# Patient Record
Sex: Male | Born: 1950 | Race: Asian | Hispanic: No | Marital: Married | Smoking: Current every day smoker
Health system: Southern US, Community
[De-identification: ages and names within clinical notes are randomized; demographics above are authoritative.]

## PROBLEM LIST (undated history)

## (undated) DIAGNOSIS — I10 Essential (primary) hypertension: Secondary | ICD-10-CM

---

## 2008-04-10 ENCOUNTER — Emergency Department (HOSPITAL_COMMUNITY): Admission: EM | Admit: 2008-04-10 | Discharge: 2008-04-10 | Payer: Self-pay | Admitting: Emergency Medicine

## 2014-06-04 ENCOUNTER — Emergency Department (HOSPITAL_COMMUNITY)
Admission: EM | Admit: 2014-06-04 | Discharge: 2014-06-05 | Disposition: A | Discharge status: Home / Self Care | Payer: Self-pay | Attending: Emergency Medicine | Admitting: Emergency Medicine

## 2014-06-04 ENCOUNTER — Emergency Department (HOSPITAL_COMMUNITY): Payer: Self-pay

## 2014-06-04 ENCOUNTER — Encounter (HOSPITAL_COMMUNITY): Payer: Self-pay

## 2014-06-04 DIAGNOSIS — I10 Essential (primary) hypertension: Secondary | ICD-10-CM | POA: Insufficient documentation

## 2014-06-04 DIAGNOSIS — M546 Pain in thoracic spine: Secondary | ICD-10-CM | POA: Insufficient documentation

## 2014-06-04 DIAGNOSIS — Z79899 Other long term (current) drug therapy: Secondary | ICD-10-CM | POA: Insufficient documentation

## 2014-06-04 DIAGNOSIS — Z720 Tobacco use: Secondary | ICD-10-CM | POA: Insufficient documentation

## 2014-06-04 DIAGNOSIS — R079 Chest pain, unspecified: Secondary | ICD-10-CM | POA: Insufficient documentation

## 2014-06-04 HISTORY — DX: Essential (primary) hypertension: I10

## 2014-06-04 LAB — BASIC METABOLIC PANEL
Anion gap: 13 (ref 5–15)
BUN: 10 mg/dL (ref 6–23)
CALCIUM: 9.1 mg/dL (ref 8.4–10.5)
CHLORIDE: 103 meq/L (ref 96–112)
CO2: 25 meq/L (ref 19–32)
CREATININE: 0.85 mg/dL (ref 0.50–1.35)
GFR calc Af Amer: >90 mL/min (ref >90)
GFR calc non Af Amer: >90 mL/min (ref >90)
Glucose, Bld: 97 mg/dL (ref 70–99)
Potassium: 3.6 mEq/L — ABNORMAL LOW (ref 3.7–5.3)
SODIUM: 141 meq/L (ref 137–147)

## 2014-06-04 LAB — I-STAT TROPONIN, ED: TROPONIN I, POC: 0 ng/mL (ref 0.00–0.08)

## 2014-06-04 LAB — CBC
HEMATOCRIT: 35.8 % — AB (ref 39.0–52.0)
HEMOGLOBIN: 11.7 g/dL — AB (ref 13.0–17.0)
MCH: 23.9 pg — AB (ref 26.0–34.0)
MCHC: 32.7 g/dL (ref 30.0–36.0)
MCV: 73.2 fL — ABNORMAL LOW (ref 78.0–100.0)
PLATELETS: 223 10*3/uL (ref 150–400)
RBC: 4.89 MIL/uL (ref 4.22–5.81)
RDW: 13.8 % (ref 11.5–15.5)
WBC: 6.3 10*3/uL (ref 4.0–10.5)

## 2014-06-04 MED ORDER — NITROGLYCERIN 0.4 MG SL SUBL
0.4000 mg | SUBLINGUAL_TABLET | SUBLINGUAL | Status: DC | PRN
  Administered 2014-06-04: 0.4 mg via SUBLINGUAL
  Filled 2014-06-04: qty 1

## 2014-06-04 MED ORDER — GI COCKTAIL ~~LOC~~
30.0000 mL | Freq: Once | ORAL | Status: AC
  Administered 2014-06-04: 30 mL via ORAL
  Filled 2014-06-04: qty 30

## 2014-06-04 NOTE — ED Notes (Signed)
PER EMS: starting at 0600 this morning pt began experiencing SOB and chest pain that radiates to mid upper back, right shoulder and arm. EMS adm 324 asa and 1 nitro with no relief. BP-159/97, HR-70, NSR,EKG unremarkable according to EMS.

## 2014-06-04 NOTE — ED Provider Notes (Signed)
CSN: [id number]     Arrival date & time 06/04/14  2128 History   First MD Initiated Contact with Patient 06/04/14 2139     Chief Complaint  Patient presents with  . Chest Pain     (Consider location/radiation/quality/duration/timing/severity/associated sxs/prior Treatment) Patient is a 63 y.o. male presenting with chest pain. The history is provided by the patient.  Chest Pain Pain location:  Substernal area Pain quality: pressure   Pain radiates to:  Upper back Pain radiates to the back: yes   Pain severity:  Severe Onset quality:  Gradual Duration:  15 hours Timing:  Constant Progression:  Unchanged Chronicity:  New Context: at rest   Context comment:  Patient noticed pain shortly after waking up at 6am Relieved by:  Nothing Worsened by:  Nothing tried Ineffective treatments:  None tried Associated symptoms: back pain and shortness of breath   Associated symptoms: no abdominal pain, no cough, no diaphoresis, no dizziness, no dysphagia, no fever, no headache, no nausea, no palpitations, no syncope, not vomiting and no weakness   Risk factors: hypertension   Risk factors: no aortic disease and no coronary artery disease     Past Medical History  Diagnosis Date  . Hypertension    History reviewed. No pertinent past surgical history. No family history on file. History  Substance Use Topics  . Smoking status: Current Every Day Smoker  . Smokeless tobacco: Not on file  . Alcohol Use: No    Review of Systems  Constitutional: Negative for fever, diaphoresis, activity change and appetite change.  HENT: Negative for facial swelling, sore throat, tinnitus, trouble swallowing and voice change.   Eyes: Negative for pain, redness and visual disturbance.  Respiratory: Positive for shortness of breath. Negative for cough, chest tightness and wheezing.   Cardiovascular: Positive for chest pain. Negative for palpitations, leg swelling and syncope.  Gastrointestinal: Negative for  nausea, vomiting, abdominal pain, diarrhea, constipation and abdominal distention.  Endocrine: Negative.   Genitourinary: Negative.  Negative for dysuria, decreased urine volume, scrotal swelling and testicular pain.  Musculoskeletal: Positive for back pain. Negative for myalgias and gait problem.  Skin: Negative.  Negative for rash.  Neurological: Negative.  Negative for dizziness, tremors, weakness and headaches.  Psychiatric/Behavioral: Negative for suicidal ideas, hallucinations and self-injury. The patient is not nervous/anxious.       Allergies  Review of patient's allergies indicates not on file.  Home Medications   Prior to Admission medications   Medication Sig Start Date End Date Taking? Authorizing Provider  acyclovir (ZOVIRAX) 400 MG tablet Take 1 tablet (400 mg total) by mouth 5 (five) times daily. 06/14/14   Lula OlszewskiMike Blayne Frankie, MD  oxyCODONE-acetaminophen (PERCOCET/ROXICET) 5-325 MG per tablet Take 1 tablet by mouth every 6 (six) hours as needed for moderate pain or severe pain. 06/05/14   Lula OlszewskiMike Allen Egerton, MD  Phenyleph-Doxylamine-DM-APAP (COLD & FLU NIGHTTIME/DAYTIME PO) Take 2 capsules by mouth daily as needed (for cold).   Yes Historical Provider, MD   BP 144/71 mmHg  Pulse 59  Temp(Src) 98.5 F (36.9 C) (Oral)  Resp 12  SpO2 99% Physical Exam  Constitutional: He is oriented to person, place, and time. He appears well-developed and well-nourished. No distress.  HENT:  Head: Normocephalic and atraumatic.  Right Ear: External ear normal.  Left Ear: External ear normal.  Nose: Nose normal.  Mouth/Throat: Oropharynx is clear and moist.  Eyes: Conjunctivae and EOM are normal. Pupils are equal, round, and reactive to light. No scleral icterus.  Neck: Normal  range of motion. Neck supple. No JVD present. No tracheal deviation present. No thyromegaly present.  Cardiovascular: Normal rate and intact distal pulses.  Exam reveals no gallop and no friction rub.   No murmur  heard. Pulmonary/Chest: Effort normal and breath sounds normal. No stridor. No respiratory distress. He has no wheezes. He has no rales.  Abdominal: Soft. He exhibits no distension. There is no tenderness. There is no rebound and no guarding.  Musculoskeletal: Normal range of motion. He exhibits tenderness (bilateral paraspinal upper thoracic back tender to palpation). He exhibits no edema.  Neurological: He is alert and oriented to person, place, and time. No cranial nerve deficit. He exhibits normal muscle tone. Coordination normal.  5/5 strength in all 4 extremities. Normal Gait.   Skin: Skin is warm and dry. Rash (faint papule rash on upper back. No vessicles. No induration or abscesses.) noted. He is not diaphoretic.  Psychiatric: He has a normal mood and affect. His behavior is normal.  Nursing note and vitals reviewed.   ED Course  Procedures (including critical care time) Labs Review Labs Reviewed  CBC - Abnormal; Notable for the following:    Hemoglobin 11.7 (*)    HCT 35.8 (*)    MCV 73.2 (*)    MCH 23.9 (*)    All other components within normal limits  BASIC METABOLIC PANEL - Abnormal; Notable for the following:    Potassium 3.6 (*)    All other components within normal limits  D-DIMER, QUANTITATIVE  I-STAT TROPOININ, ED  Rosezena Sensor, ED    Imaging Review Dg Chest 2 View  06/04/2014   CLINICAL DATA:  Acute onset of shortness of breath and cough. Right posterior upper chest pain. Initial encounter.  EXAM: CHEST  2 VIEW  COMPARISON:  None.  FINDINGS: The lungs are well-aerated and clear. There is no evidence of focal opacification, pleural effusion or pneumothorax.  The cardiomediastinal silhouette is mildly enlarged. No acute osseous abnormalities are seen.  IMPRESSION: Mild cardiomegaly; lungs remain grossly clear.   Electronically Signed   By: Roanna Raider M.D.   On: 06/04/2014 23:40     EKG Interpretation   Date/Time:  Monday June 04 2014 21:33:16  EST Ventricular Rate:  64 PR Interval:  50 QRS Duration: 107 QT Interval:  435 QTC Calculation: 449 R Axis:   87 Text Interpretation:  Sinus rhythm Short PR interval Borderline right axis  deviation ST elevation, consider anterior injury No old tracing to compare  Confirmed by [facility]  MD, TREY ([id number]) on 06/04/2014 11:11:50 PM      MDM   Final diagnoses:  Chest pain  Bilateral thoracic back pain    The patient is a 63 y.o. M with hx of HTN who presents with substernal radiating to his back since he woke up this morning. Patient AFVSS. Exam as above. EKG as above. Patient with wells 0 and undetectable d-dimer, do not suspect PE. Patient with equal pulses bilaterally no neurological deficits, normal mediastinum on chest xray, and undetectable d-dimer, do not suspect aortic dissection. Delta troponin are both undetectable. Patient ambulated in the ED with no desaturation or worsening of pain. Patient given pain meds and acyclovir for possible shingles but told to only fill acyclovir if vesicular rash develops. Patient given resources to establish care with PCP and standard ED return precautions. Patient and family express understanding and agreement with this plan.  I estimate there is LOW risk for PERICARDIAL TAMPONADE, PNEUMOTHORAX, PULMONARY EMBOLISM, ACUTE CORONARY SYNDROME, OR THORACIC  AORTIC DISSECTION, thus I consider the discharge disposition reasonable. We have discussed the diagnosis and risks, and we agree with discharging home to follow-up with their primary doctor. We also discussed returning to the Emergency Department immediately if new or worsening symptoms occur. We have discussed the symptoms which are most concerning (e.g., bloody sputum, fever, worsening pain or shortness of breath, vomiting) that necessitate immediate return.   Patient seen with attending, Dr. Loretha StaplerWofford, who oversaw clinical decision making.     Lula OlszewskiMike Brandy Zuba, MD 06/05/14 0111  Warnell Foresterrey Wofford,  MD 06/05/14 [phone]

## 2014-06-05 LAB — I-STAT TROPONIN, ED: Troponin i, poc: 0 ng/mL (ref 0.00–0.08)

## 2014-06-05 LAB — D-DIMER, QUANTITATIVE: D-Dimer, Quant: <0.27 ug/mL-FEU (ref 0.00–0.48)

## 2014-06-05 MED ORDER — OXYCODONE-ACETAMINOPHEN 5-325 MG PO TABS: 1.0000 | ORAL_TABLET | Freq: Four times a day (QID) | ORAL | Status: DC | PRN

## 2014-06-05 MED ORDER — OXYCODONE-ACETAMINOPHEN 5-325 MG PO TABS
1.0000 | ORAL_TABLET | Freq: Once | ORAL | Status: AC
  Administered 2014-06-05: 1 via ORAL
  Filled 2014-06-05: qty 1

## 2014-06-05 MED ORDER — ACYCLOVIR 400 MG PO TABS: 400.0000 mg | ORAL_TABLET | Freq: Every day | ORAL | Status: DC

## 2014-06-05 MED ORDER — MORPHINE SULFATE 4 MG/ML IJ SOLN
6.0000 mg | Freq: Once | INTRAMUSCULAR | Status: DC
Start: 2014-06-05 — End: 2014-06-05
  Filled 2014-06-05: qty 2

## 2014-06-05 NOTE — Discharge Instructions (Signed)
°Emergency Department Resource Guide °1) Find a Doctor and Pay Out of Pocket °Although you won't have to find out who is covered by your insurance plan, it is a good idea to ask around and get recommendations. You will then need to call the office and see if the doctor you have chosen will accept you as a new patient and what types of options they offer for patients who are self-pay. Some doctors offer discounts or will set up payment plans for their patients who do not have insurance, but you will need to ask so you aren't surprised when you get to your appointment. ° °2) Contact Your Local Health Department °Not all health departments have doctors that can see patients for sick visits, but many do, so it is worth a call to see if yours does. If you don't know where your local health department is, you can check in your phone book. The CDC also has a tool to help you locate your state's health department, and many state websites also have listings of all of their local health departments. ° °3) Find a Walk-in Clinic °If your illness is not likely to be very severe or complicated, you may want to try a walk in clinic. These are popping up all over the country in pharmacies, drugstores, and shopping centers. They're usually staffed by nurse practitioners or physician assistants that have been trained to treat common illnesses and complaints. They're usually fairly quick and inexpensive. However, if you have serious medical issues or chronic medical problems, these are probably not your best option. ° °No Primary Care Doctor: °- Call [facility] at  832-8000 - they can help you locate a primary care doctor that  accepts your insurance, provides certain services, etc. °- Physician Referral Service- 1-800-533-3463 ° °Chronic Pain Problems: °Organization         Address  Phone   Notes  °[facility]  (336) 297-2271 Patients need to be referred by their primary care doctor.  ° °Medication  Assistance: °Organization         Address  Phone   Notes  °[place] [facility] [address] °[place], [place] [address] (336) 641-8030 --Must be a resident of [place] °-- Must have NO insurance coverage whatsoever (no Medicaid/ Medicare, etc.) °-- The pt. MUST have a primary care doctor that directs their care regularly and follows them in the community °  °[facility]  (866) 331-1348   °[employer]  (888) 892-1162   ° °Agencies that provide inexpensive medical care: °Organization         Address  Phone   Notes  °[facility]  (336) 832-8035   °[facility]    (336) 832-7272   °[facility] [address] °[place], [place] [address] (336) 832-4777   °[facility] [address], °[place] (336) 271-4999   °[facility]    (336) 373-0678   °[facility]    (336) 272-1050   °[facility] ° [address], [place] Phone:  (336) 832-4444, Fax:  (336) 832-4440 Hours of Operation:  9 am - 6 pm, M-F.  Also accepts Medicaid/Medicare and self-pay.  °[facility] ° [address], [place] Phone: (336) 832-3150, Fax: (336) 832-3151. Hours of Operation:  8:30 am - 5:30 pm, M-F.  Also accepts Medicaid and self-pay.  °[facility] [place] [address], [place] Phone: (336) 878-6027   °[facility] [address], [place], [place] (336)723-1848, Ext. 123 Mondays & Thursdays: 7-9 AM.  First 15 patients are seen on a first come, first serve basis. °  ° °Medicaid-accepting [place] Providers: ° °Organization         Address  Phone   Notes  °[facility] [address], [place] (336) 641-2100 Also accepts self-pay patients.  °[facility] [address], [place] ° (336) 856-9996   °[facility] [address], [place]  (336) 288-8857   °[facility] [address] [place] [address], [place] (336) 299-7000   °Veita Bland [address], [place]  ° (336) 373-1557 Only accepts [place] Access Medicaid patients after they have their name applied to their card.  ° °Self-Pay (no insurance) in [place]: ° °Organization         Address  Phone   Notes  °Sickle Cell Patients, [facility] [address], [place] (336) 832-1970   °[facility] [address], [place] (336) 832-4400   °[facility] ° [address], [place] (336) 992-4800   °[facility]/Dr. Osei-Bonsu ° [address] [place] [address], [place] or [address], [place] (336) 841-8500 Phone number for both [place] and [place] locations is the same.  °[facility] [address], [place] (336) 299-0000   °[facility] [address] [place] [address], [place] or [address] (336) 852-7530 °(336) 878-2260   °[facility] [address], [place] (336) 350-1642, phone; (336) 294-5005, fax Sees patients 1st and 3rd Saturday of every month.  Must not qualify for public or private insurance (i.e. Medicaid, Medicare, [place] Health Choice, Veterans' Benefits) • Household income should be no more than 200% of the poverty level •The clinic cannot treat you if you are pregnant or think you are pregnant • Sexually transmitted diseases are not treated at the clinic.  ° ° °Dental Care: °Organization         Address  Phone  Notes  °[place] [facility] [address], [place] (336) 641-6152 Accepts children up to age 21 who are enrolled in Medicaid or [place] Health Choice; pregnant women with a Medicaid card; and children who have applied for Medicaid or [place] Health Choice, but were declined, whose parents can pay a reduced fee at time of service.  °[place]  [facility] [place]  [address], [place] (336) 641-7733 Accepts children up to age 21 who are enrolled in Medicaid or [place] Health Choice; pregnant women with a Medicaid card; and children who have applied for Medicaid or [place] Health Choice, but were declined, whose parents can pay a reduced fee at time of service.  °[facility] ° [address], [place] (336) 641-4533 Patients are seen by appointment only. Walk-ins are not accepted. [facility] will see patients 18 years of age and older. °Monday - Tuesday (8am-5pm) °Most Wednesdays (8:30-5pm) °$30 per visit, cash only  °[facility] ° [address], [place] (336) 641-4533 Patients are seen by appointment only. Walk-ins are not accepted. [facility] will see patients 18 years of age and older. °One   Wednesday Evening (Monthly: Volunteer Based).  $30 per visit, cash only  °[facility]  (919) 537-3737 for adults; Children under age 4, call Graduate Pediatric Dentistry at (919) [phone]. Children aged 4-14, please call (919) [phone] to request a pediatric application. ° Dental services are provided in all areas of dental care including fillings, crowns and bridges, complete and partial dentures, implants, gum treatment, root canals, and extractions. Preventive care is also provided. Treatment is provided to both adults and children. °Patients are selected via a lottery and there is often a waiting list. °  °[facility] [address], °[place] ° (336) 763-8833 [email or web address] °  °[facility] [address], [place], [place] (336)723-1848, Ext. 123 Second and Fourth Thursday of each month, opens at 6:30 AM; Clinic ends at 9 AM.  Patients are seen on a first-come first-served basis, and a limited number are seen during each clinic.  ° °[facility] ° [address], [place], [place] (336) 723-7904    Eligibility Requirements °You must have lived in [place], [place], or [place] counties for at least the last three months. °  You cannot be eligible for state or federal sponsored healthcare insurance, including Veterans Administration, Medicaid, or Medicare. °  You generally cannot be eligible for healthcare insurance through your employer.  °  How to apply: °Eligibility screenings are held every Tuesday and Wednesday afternoon from 1:00 pm until 4:00 pm. You do not need an appointment for the interview!  °[facility] [address], [place], [place] 336-631-2330   °[facility]  336-342-8273   °[facility]  336-703-3100   °[facility]  336-570-6415   ° °Behavioral Health Resources in the Community: °Intensive Outpatient Programs °Organization         Address  Phone  Notes  °[facility] [address], [place], [place] 336-878-6098   °[facility] [address], [place], [place] 336-832-9800   °[facility] [address], [place], [place] ° 336-882-2125   °[facility] [address],  °[place], [place] 1-800-853-5163 or 336-641-4981   °Substance Abuse Resources °Organization         Address  Phone  Notes  °[facility]  336-882-2125   °[facility]  336-784-9470   °[facility]  336-285-9073   °[facility]  336-845-3988   °Residential & Outpatient Substance Abuse Program  1-800-659-3381   °Psychological Services °Organization         Address  Phone  Notes  ° Health  336- 832-9600   °[facility]  336- 378-7881   °[facility] [address], [place] 1-800-853-5163 or 336-641-4981   ° °Mobile Crisis Teams °Organization         Address  Phone  Notes  °[facility], Mobile Crisis Care Unit  1-877-626-1772   °Assertive °[facility] ° [address]  [place], [place] 336-834-9664   °Sharon DeEsch [address] °[place] 336-554-5454   ° °Self-Help/Support Groups °Organization         Address  Phone             Notes  °[facility] - variety of support groups  336- 373-1402 Call for more information  °Narcotics Anonymous (NA), [facility] [address], °[place]  2 meetings at this location  ° °  Residential Treatment Programs °Organization         Address  Phone  Notes  °[facility] [address],    °[place]  1-866-801-8205   °[facility] ° [address], [place], [place] 704-293-8524   °[facility] [address], [place] 336-845-3988 Admissions: 8am-3pm M-F  °[facility] [address]    °[place], [place] 336-841-1104   °[facility] [address], [place], [place] 336-379-7146   °[facility] [address]  °[place], [place] 336-285-9073   °[facility] - Intensive Outpatient [address], [place], [place] 336-852-3033   °[facility] [address]  °[place], [place] 1-877-615-2722 or 336-784-9470   °[facility] ([facility] [address] , [place] 336-227-7417 Accepts Medicaid  °[facility] [address]  ° [place] 1-800-659-3381 Substance Abuse/Addiction Treatment  ° °[place] Behavioral Health Resources °Organization         Address  Phone  Notes  °[facility]  (888) 581-9988   °Julie Brannon, PhD [address] [place], [place]   (336) 349-5553 or (336) 951-0000   °[facility]   [address] °[place], [place] (336) 349-4454   °[facility] [address], [place], [place] (336) 342-8316 Insurance/Medicaid/sponsorship through [facility]  °[facility] [address]                                    [place], [place] (336) 342-8316 Therapy/tele-psych/case    °[facility] [address]  ° [place], [place] (336) 349-2233    °Dr. Arfeen  (336) 349-4544   °[facility] [place]  [employer] [place] [facility] 1) [address], [place] °2) [address], [place] °3)  [address], [place] (336) 349-3220 °(336) 342-7768 ° °(336) 342-8140   °[place] Child [facility] (336) 342-1394 or (336) 342-3537 (After Hours)    ° ° °

## 2014-06-05 NOTE — ED Notes (Signed)
Pt A&OX4, ambulatory at d/c with steady gait, NAD 

## 2014-06-05 NOTE — ED Provider Notes (Signed)
I saw and evaluated the patient, reviewed the resident's note and I agree with the findings and plan.   EKG Interpretation   Date/Time:  Monday June 04 2014 21:33:16 EST Ventricular Rate:  64 PR Interval:  50 QRS Duration: 107 QT Interval:  435 QTC Calculation: 449 R Axis:   87 Text Interpretation:  Sinus rhythm Short PR interval Borderline right axis  deviation ST elevation, consider anterior injury No old tracing to compare  Confirmed by [facility]  MD, TREY ([id number]) on 06/04/2014 11:11:50 PM      63 yo male presenting with back/chest pain.  He characterized his pain to me as back pain radiating to his front.  Located at thoracic right paraspinals.  Also reports some SOB.  On exam, well appearing, nontoxic, not distressed, normal respiratory effort, normal perfusion, heart sounds normal, RRR, lungs CTAB, patch of papules of skin at right thoracic region.  He appeared to be uncomfortable when changing positions.  His EKG showed nonspecific ST changes which appeared consistent with repolarization abnormality.  He had two negative troponins, negative D Dimer.  I suspect his pain may be coming from a developing case of varicella zoster.  Advised close outpatient follow up.   Clinical Impression: Right sided Thoracic Back pain   Warnell Foresterrey Umer Harig, MD 06/05/14 1257

## 2014-10-05 ENCOUNTER — Ambulatory Visit: Payer: Self-pay | Attending: Internal Medicine

## 2015-11-25 ENCOUNTER — Encounter (HOSPITAL_COMMUNITY): Payer: Self-pay | Admitting: Emergency Medicine

## 2015-11-25 DIAGNOSIS — K529 Noninfective gastroenteritis and colitis, unspecified: Secondary | ICD-10-CM | POA: Insufficient documentation

## 2015-11-25 DIAGNOSIS — I10 Essential (primary) hypertension: Secondary | ICD-10-CM | POA: Insufficient documentation

## 2015-11-25 DIAGNOSIS — Z79899 Other long term (current) drug therapy: Secondary | ICD-10-CM | POA: Insufficient documentation

## 2015-11-25 DIAGNOSIS — F17200 Nicotine dependence, unspecified, uncomplicated: Secondary | ICD-10-CM | POA: Insufficient documentation

## 2015-11-25 DIAGNOSIS — R1030 Lower abdominal pain, unspecified: Secondary | ICD-10-CM | POA: Diagnosis present

## 2015-11-25 LAB — URINE MICROSCOPIC-ADD ON
BACTERIA UA: NONE SEEN
SQUAMOUS EPITHELIAL / LPF: NONE SEEN
WBC, UA: NONE SEEN WBC/hpf (ref 0–5)

## 2015-11-25 LAB — COMPREHENSIVE METABOLIC PANEL
ALT: 47 U/L (ref 17–63)
AST: 23 U/L (ref 15–41)
Albumin: 4.2 g/dL (ref 3.5–5.0)
Alkaline Phosphatase: 71 U/L (ref 38–126)
Anion gap: 8 (ref 5–15)
BUN: 11 mg/dL (ref 6–20)
CHLORIDE: 103 mmol/L (ref 101–111)
CO2: 27 mmol/L (ref 22–32)
CREATININE: 1.04 mg/dL (ref 0.61–1.24)
Calcium: 9.5 mg/dL (ref 8.9–10.3)
GFR calc Af Amer: >60 mL/min (ref >60)
GFR calc non Af Amer: >60 mL/min (ref >60)
GLUCOSE: 109 mg/dL — AB (ref 65–99)
POTASSIUM: 4.3 mmol/L (ref 3.5–5.1)
SODIUM: 138 mmol/L (ref 135–145)
Total Bilirubin: 1.1 mg/dL (ref 0.3–1.2)
Total Protein: 7.4 g/dL (ref 6.5–8.1)

## 2015-11-25 LAB — URINALYSIS, ROUTINE W REFLEX MICROSCOPIC
Bilirubin Urine: NEGATIVE
GLUCOSE, UA: NEGATIVE mg/dL
Ketones, ur: NEGATIVE mg/dL
Leukocytes, UA: NEGATIVE
Nitrite: NEGATIVE
PH: 6 (ref 5.0–8.0)
Protein, ur: NEGATIVE mg/dL
SPECIFIC GRAVITY, URINE: 1.027 (ref 1.005–1.030)

## 2015-11-25 LAB — CBC WITH DIFFERENTIAL/PLATELET
BASOS PCT: 0 %
Basophils Absolute: 0 10*3/uL (ref 0.0–0.1)
EOS ABS: 0.1 10*3/uL (ref 0.0–0.7)
Eosinophils Relative: 1 %
HCT: 39.9 % (ref 39.0–52.0)
Hemoglobin: 12.6 g/dL — ABNORMAL LOW (ref 13.0–17.0)
Lymphocytes Relative: 26 %
Lymphs Abs: 2.4 10*3/uL (ref 0.7–4.0)
MCH: 23.2 pg — AB (ref 26.0–34.0)
MCHC: 31.6 g/dL (ref 30.0–36.0)
MCV: 73.6 fL — ABNORMAL LOW (ref 78.0–100.0)
MONO ABS: 0.6 10*3/uL (ref 0.1–1.0)
Monocytes Relative: 6 %
NEUTROS PCT: 67 %
Neutro Abs: 6.2 10*3/uL (ref 1.7–7.7)
PLATELETS: 239 10*3/uL (ref 150–400)
RBC: 5.42 MIL/uL (ref 4.22–5.81)
RDW: 14.6 % (ref 11.5–15.5)
WBC: 9.3 10*3/uL (ref 4.0–10.5)

## 2015-11-25 NOTE — ED Notes (Signed)
Pt c/o mid to lower abd pain onset yesterday.  Nausea with vomiting.  Last BM yesterday

## 2015-11-26 ENCOUNTER — Encounter (HOSPITAL_COMMUNITY): Payer: Self-pay | Admitting: Radiology

## 2015-11-26 ENCOUNTER — Emergency Department (HOSPITAL_COMMUNITY): Payer: BLUE CROSS/BLUE SHIELD

## 2015-11-26 ENCOUNTER — Emergency Department (HOSPITAL_COMMUNITY)
Admission: EM | Admit: 2015-11-26 | Discharge: 2015-11-26 | Disposition: A | Discharge status: Home / Self Care | Payer: BLUE CROSS/BLUE SHIELD | Attending: Emergency Medicine | Admitting: Emergency Medicine

## 2015-11-26 DIAGNOSIS — K529 Noninfective gastroenteritis and colitis, unspecified: Secondary | ICD-10-CM

## 2015-11-26 LAB — POC OCCULT BLOOD, ED: FECAL OCCULT BLD: NEGATIVE

## 2015-11-26 MED ORDER — CIPROFLOXACIN HCL 500 MG PO TABS
500.0000 mg | ORAL_TABLET | Freq: Once | ORAL | Status: AC
Start: 2015-11-26 — End: 2015-11-26
  Administered 2015-11-26: 500 mg via ORAL
  Filled 2015-11-26: qty 1

## 2015-11-26 MED ORDER — METRONIDAZOLE 500 MG PO TABS: 500.0000 mg | ORAL_TABLET | Freq: Three times a day (TID) | ORAL | Status: DC

## 2015-11-26 MED ORDER — METRONIDAZOLE 500 MG PO TABS
500.0000 mg | ORAL_TABLET | Freq: Once | ORAL | Status: AC
Start: 2015-11-26 — End: 2015-11-26
  Administered 2015-11-26: 500 mg via ORAL
  Filled 2015-11-26: qty 1

## 2015-11-26 MED ORDER — CIPROFLOXACIN HCL 500 MG PO TABS: 500.0000 mg | ORAL_TABLET | Freq: Two times a day (BID) | ORAL | Status: DC

## 2015-11-26 MED ORDER — IOPAMIDOL (ISOVUE-300) INJECTION 61%
INTRAVENOUS | Status: AC
  Administered 2015-11-26: 100 mL
  Filled 2015-11-26: qty 100

## 2015-11-26 NOTE — ED Provider Notes (Signed)
CSN: [id number]     Arrival date & time 11/25/15  1616 History   First MD Initiated Contact with Patient 11/26/15 0114     Chief Complaint  Patient presents with  . Abdominal Pain     (Consider location/radiation/quality/duration/timing/severity/associated sxs/prior Treatment) HPI Comments: Patient presents with complaint of midline lower abdominal pain starting one and a half days ago. Patient has had associated nausea and vomiting. He has had constipation. States difficulty starting urine stream however no pain with urination. No fevers. No history of abdominal surgeries. Last albumin was 2 days ago. No significant pain with bowel movements. No treatments prior to arrival. Onset of symptoms acute. Course is constant. Nothing makes symptoms better or worse.  Patient is a 65 y.o. male presenting with abdominal pain. The history is provided by the patient. The history is limited by a language barrier. A language interpreter was used (Family member).  Abdominal Pain Associated symptoms: constipation, nausea and vomiting   Associated symptoms: no chest pain, no cough, no diarrhea, no dysuria, no fever, no hematuria and no sore throat     Past Medical History  Diagnosis Date  . Hypertension    History reviewed. No pertinent past surgical history. No family history on file. Social History  Substance Use Topics  . Smoking status: Current Every Day Smoker  . Smokeless tobacco: None  . Alcohol Use: No    Review of Systems  Constitutional: Negative for fever.  HENT: Negative for rhinorrhea and sore throat.   Eyes: Negative for redness.  Respiratory: Negative for cough.   Cardiovascular: Negative for chest pain.  Gastrointestinal: Positive for nausea, vomiting, abdominal pain and constipation. Negative for diarrhea and blood in stool.  Genitourinary: Positive for difficulty urinating. Negative for dysuria, frequency and hematuria.  Musculoskeletal: Negative for myalgias.  Skin: Negative  for rash.  Neurological: Negative for headaches.    Allergies  Review of patient's allergies indicates not on file.  Home Medications   Prior to Admission medications   Medication Sig Start Date End Date Taking? Authorizing Provider  acyclovir (ZOVIRAX) 400 MG tablet Take 1 tablet (400 mg total) by mouth 5 (five) times daily. 06/14/14   Lula Olszewski, MD  oxyCODONE-acetaminophen (PERCOCET/ROXICET) 5-325 MG per tablet Take 1 tablet by mouth every 6 (six) hours as needed for moderate pain or severe pain. 06/05/14   Lula Olszewski, MD  Phenyleph-Doxylamine-DM-APAP (COLD & FLU NIGHTTIME/DAYTIME PO) Take 2 capsules by mouth daily as needed (for cold).    Historical Provider, MD   BP 180/77 mmHg  Pulse 58  Temp(Src) 97.6 F (36.4 C) (Oral)  Resp 20  Ht  (1.575 m)  Wt 65.772 kg  BMI 26.51 kg/m2  SpO2 100%   Physical Exam  Constitutional: He appears well-developed and well-nourished.  HENT:  Head: Normocephalic and atraumatic.  Eyes: Conjunctivae are normal. Right eye exhibits no discharge. Left eye exhibits no discharge.  Neck: Normal range of motion. Neck supple.  Cardiovascular: Normal rate, regular rhythm and normal heart sounds.   Pulmonary/Chest: Effort normal and breath sounds normal.  Abdominal: Soft. There is tenderness (Moderate periumbilical and midline lower abdominal pain.). There is no rebound and no guarding.  Genitourinary: Rectum normal. Rectal exam shows no external hemorrhoid, no fissure and anal tone normal. Prostate is not tender.  Neurological: He is alert.  Skin: Skin is warm and dry.  Psychiatric: He has a normal mood and affect.  Nursing note and vitals reviewed.   ED Course  Procedures (including critical care  time) Labs Review Labs Reviewed  CBC WITH DIFFERENTIAL/PLATELET - Abnormal; Notable for the following:    Hemoglobin 12.6 (*)    MCV 73.6 (*)    MCH 23.2 (*)    All other components within normal limits  COMPREHENSIVE METABOLIC PANEL -  Abnormal; Notable for the following:    Glucose, Bld 109 (*)    All other components within normal limits  URINALYSIS, ROUTINE W REFLEX MICROSCOPIC (NOT AT [facility]) - Abnormal; Notable for the following:    Color, Urine AMBER (*)    APPearance HAZY (*)    Hgb urine dipstick MODERATE (*)    All other components within normal limits  URINE MICROSCOPIC-ADD ON  POC OCCULT BLOOD, ED    Imaging Review Ct Abdomen Pelvis W Contrast  11/26/2015  CLINICAL DATA:  Mid to lower abdominal pain since yesterday. Nausea and vomiting. Periumbilical pain. EXAM: CT ABDOMEN AND PELVIS WITH CONTRAST TECHNIQUE: Multidetector CT imaging of the abdomen and pelvis was performed using the standard protocol following bolus administration of intravenous contrast. CONTRAST:  100mL ISOVUE-300 IOPAMIDOL (ISOVUE-300) INJECTION 61% COMPARISON:  None. FINDINGS: Dependent changes in the lung bases. Small amount of free fluid in the upper abdomen around the liver and spleen. The liver, spleen, gallbladder, pancreas, adrenal glands, kidneys, abdominal aorta, inferior vena cava, and retroperitoneal lymph nodes are unremarkable. Stomach, small bowel, and colon are not abnormally distended. There is diffuse wall thickening of the distal colon extending from the lower pelvic loops to the terminal ileum. There is fluid in the mesentery around the abnormal bowel loops. Changes are consistent with enteritis in could represent infectious or inflammatory causes. Crohn's disease could have this appearance. No free air in the abdomen. Pelvis: Borderline prostate gland at 3.7 cm. Bladder wall is not thickened. No pelvic mass or lymphadenopathy. Appendix is normal. Degenerative changes in the spine. No destructive bone lesions. IMPRESSION: Diffuse wall thickening in the terminal ileum and pelvic small bowel loops with associated mesenteric fluid. Changes are likely due to enteritis of nonspecific etiology. Free fluid in the abdomen and pelvis is likely  reactive. Electronically Signed   By: Burman NievesWilliam  Stevens M.D.   On: 11/26/2015 03:08   I have personally reviewed and evaluated these images and lab results as part of my medical decision-making.   2:04 AM Patient seen and examined. Work-up initiated. Medications ordered. Prostate exam performed with nurse chaperone. Minimal prostate tenderness. Given abdominal tenderness, unclear etiology, CT scan ordered.  Vital signs reviewed and are as follows: BP 180/77 mmHg  Pulse 58  Temp(Src) 97.6 F (36.4 C) (Oral)  Resp 20  Ht 5\' 2"  (1.575 m)  Wt 65.772 kg  BMI 26.51 kg/m2  SpO2 100%  3:39 AM Patient and family updated on results. No indication for admission. Pain is controlled.   Will place on cipro and flagyl. Pt does not have PCP. Referrals given.   The patient was urged to return to the Emergency Department immediately with worsening of current symptoms, worsening abdominal pain, persistent vomiting, blood noted in stools, fever, or any other concerns. The patient verbalized understanding.    MDM   Final diagnoses:  Enteritis   Patient with abdominal pain -- CT reveals enteritis/colitis with fluid. Vitals are stable, no fever. No signs of dehydration, tolerating PO's. Lungs are clear. Abx given. Supportive therapy indicated with return if symptoms worsen. Patient counseled.    Renne CriglerJoshua Juwana Thoreson, PA-C 11/26/15 [id number]  Gilda Creasehristopher J Pollina, MD 11/26/15 [phone]

## 2015-11-26 NOTE — ED Notes (Signed)
Patient transported to CT 

## 2015-11-26 NOTE — Discharge Instructions (Signed)
Please read and follow all provided instructions.  Your diagnoses today include:  1. Enteritis    Tests performed today include:  Blood counts and electrolytes  Blood tests to check liver and kidney function  Urine test to look for infection and pregnancy (in women)  CT scan - shows small bowel inflammation  Vital signs. See below for your results today.   Medications prescribed:   Ciprofloxacin - antibiotic  You have been prescribed an antibiotic medicine: take the entire course of medicine even if you are feeling better. Stopping early can cause the antibiotic not to work.   Metronidazole - antibiotic  You have been prescribed an antibiotic medicine: take the entire course of medicine even if you are feeling better. Stopping early can cause the antibiotic not to work. Do not drink alcohol when taking this medication.   Take any prescribed medications only as directed.  Home care instructions:   Follow any educational materials contained in this packet.   Drink clear liquids for the next 24 hours and introduce solid foods slowly after 24 hours using the b.r.a.t. diet (Bananas, Rice, Applesauce, Toast, Yogurt).    Follow-up instructions: Please follow-up with your primary care provider in the next 3 days for further evaluation of your symptoms. If you are not feeling better in 48 hours you may have a condition that is more serious and you need re-evaluation.   Return instructions:  SEEK IMMEDIATE MEDICAL ATTENTION IF:  If you have pain that does not go away or becomes severe   A temperature above 101F develops   Repeated vomiting occurs (multiple episodes)   If you have pain that becomes localized to portions of the abdomen. The right side could possibly be appendicitis. In an adult, the left lower portion of the abdomen could be colitis or diverticulitis.   Blood is being passed in stools or vomit (bright red or black tarry stools)   You develop chest pain,  difficulty breathing, dizziness or fainting, or become confused, poorly responsive, or inconsolable (young children)  If you have any other emergent concerns regarding your health  Additional Information: Abdominal (belly) pain can be caused by many things. Your caregiver performed an examination and possibly ordered blood/urine tests and imaging (CT scan, x-rays, ultrasound). Many cases can be observed and treated at home after initial evaluation in the emergency department. Even though you are being discharged home, abdominal pain can be unpredictable. Therefore, you need a repeated exam if your pain does not resolve, returns, or worsens. Most patients with abdominal pain don't have to be admitted to the hospital or have surgery, but serious problems like appendicitis and gallbladder attacks can start out as nonspecific pain. Many abdominal conditions cannot be diagnosed in one visit, so follow-up evaluations are very important.  Your vital signs today were: BP 180/77 mmHg   Pulse 58   Temp(Src) 97.6 F (36.4 C) (Oral)   Resp 20   Ht 5\' 2"  (1.575 m)   Wt 65.772 kg   BMI 26.51 kg/m2   SpO2 100% If your blood pressure (bp) was elevated above 135/85 this visit, please have this repeated by your doctor within one month. -------------- [employer] is a great source of information about community services available.  Access by dialing 2-1-1 from anywhere in [place], or by website -  PooledIncome.pl.   Other Local Resources (Updated 06/2015)  Financial Assistance   Services    Phone Number and Address  [facility]  Low-cost medical care - 1st and 3rd Saturday of every month  Must not qualify for public or private insurance and must have limited income [phone] [address] [place], [place]    [place] [facility] Social Services  Child care  Emergency assistance for housing and [employer]ilities  Food  stamps  Medicaid [id number] [address] [place], [place] [address]   [facility]  Low-cost medical care for children, communicable diseases, sexually-transmitted diseases, immunizations, maternity care, womens health and family planning [phone] [place], [place] [address]  [facility]   Medication assistance for [facility] [place] residents  Must meet income requirements [phone] [address] [place], [place].    [facility]  Child care  Emergency assistance for housing and [employer]Clark[employer]ilities  Food stamps  Medicaid [phone] [address] [place], [place] [address]  [facility]   Low-cost medical care,   Monday through Friday, 9 am to 6 pm.   Accepts Medicare/Medicaid, and self-pay [phone] [address] [place], [place] [address]  [facility]  Low-cost medical care - Monday through Friday, 8:30 am - 5:30 pm  Accepts Medicaid and self-pay [phone] [address] [place], [place] [address]   [facility]  Primary medical care, including for those with sickle cell disease  Accepts Medicare, Medicaid, insurance and self-pay [phone] [address] [place], [place]  [facility]   Primary medical care  Accepts Medicare, [place], insurance and self-pay [phone] [address] [place], [place] [address]   [facility] of Social Services  Child care  Emergency assistance for housing and [employer]ly[employer]Clark[employer]ilities  Food stamps  Medicaid [id number] [address] [place], [place] [address]  [facility] Department of Health and [employer] Services  Child care  Emergency assistance for housing and Kimberly[employer]Clarkutilities  Food stamps  Medicaid [phone] [address] [place], [place] [address]   [facility]  Medication Assistance Program  Medication assistance for [facility] residents with no insurance only  Must have a primary care doctor [phone] [address] [place], [place]  [facility]   Primary medical care  AvimorAccepts Medicare, [place]icaid, insurance  [phone] [address] [place], [place]  [facility]   Medication assistance [phone]  [facility]   Primary medical care  Accepts Medicare, [place], insurance and self-pay [phone] [address] [place], [place] [address]  [facility]   Primary medical care  Accepts Medicare, [place], insurance and self-pay [phone] [address] [place], [place] [address]  [facility]  For [place] residents between the ages of 2818 and 3064 who do not have any form of health insurance, Medicare, [place], or [place] benefits.  Services are provided free of charge to uninsured patients who fall within federal poverty guidelines.    Hours: Tuesdays and Thursdays, 4:15 - 8 pm [phone] [address], [address] [place], [place] [address]  [facility]     Primary medical care  Dental care  Nutritional counseling  Pharmacy  Accepts Medicaid, Medicare, most insurance.  Fees are adjusted based on ability to pay.   [phone] [facility] [address] [place], [place]  [phone] [facility] [address] [place], [place]  [phone] [facility] [place],  [place]  [phone] [facility], [address] [place], [place]  [phone] [facility] [address] [place], [place]  [facility]  Womens health and family planning [phone] [place], [place]  [facility] Department of Social Services  Child care  Emergency assistance for housing and  [place]ark  Medicaid [phone], [place], [place] [address]   [facility]    Ages 90 and older  Hours: Mondays and Thursdays, 7:00 am - 9:00 am Patients are seen on a first come, first served basis. [phone], [phone] [place], [place]  [facility] Division of Social Services  Child care  Emergency assistance for housing and [employer]  Medicaid [phone] [address], [place] [address]  [employer]  Medication assistance  Rental assistance  Food pantry  Medication assistance  Housing assistance  Emergency food distribution  Utility assistance [phone] [address] [place], [place]  [phone]  [address] [place], [place] [address] Hours: Tuesdays and Thursdays from 9am - 12 noon by appointment only  [phone] [address] [place], [place] [address]  [facility] - Lanae Boast   Accepts private insurance, [place], and [place].  Payment is based on a sliding scale for those without insurance.  Hours: Mondays, Tuesdays and Thursdays, 8:30 am - 5:30 pm.   [phone] [address] [place], [place]  [facility] - Family Medicine at [facility], [place], and [place].  Payment is based on a sliding scale for those without insurance. [phone] [address] [place], [place]  [facility] - Pediatrics at E. Scientist, research (physical sciences), [employer], and [place].  Payment is based on a sliding scale for those without insurance [phone] [address], [employer], [place]  [facility] - Pediatrics at [employer], [place], and [place].  Payment is based on a sliding scale for those without insurance. [phone] [address] [place], [place]  [facility] - Pediatrics at [facility], [place], and [place].  Payment is based on a sliding scale for those without insurance. [phone], [phone] , [place].    [facility]  Maternity care.  Accepts Medicaid and self-pay. [phone] [address] [place], [place]

## 2016-11-18 ENCOUNTER — Encounter: Payer: Self-pay | Admitting: Emergency Medicine

## 2016-11-18 ENCOUNTER — Ambulatory Visit (INDEPENDENT_AMBULATORY_CARE_PROVIDER_SITE_OTHER): Payer: BLUE CROSS/BLUE SHIELD | Admitting: Emergency Medicine

## 2016-11-18 VITALS — BP 166/81 | HR 68 | Temp 98.3°F | Resp 16 | Ht 62.84 in | Wt 150.6 lb

## 2016-11-18 DIAGNOSIS — I10 Essential (primary) hypertension: Secondary | ICD-10-CM

## 2016-11-18 MED ORDER — AMLODIPINE BESYLATE 10 MG PO TABS: 10.0000 mg | ORAL_TABLET | Freq: Every day | ORAL | 3 refills | Status: DC

## 2016-11-18 NOTE — Patient Instructions (Addendum)
   IF you received an x-ray today, you will receive an invoice from [facility]. Please contact [facility] at [phone] with questions or concerns regarding your invoice.   IF you received labwork today, you will receive an invoice from [facility]. Please contact [facility] at [phone] with questions or concerns regarding your invoice.   Our billing staff will not be able to assist you with questions regarding bills from these companies.  You will be contacted with the lab results as soon as they are available. The fastest way to get your results is to activate your My Chart account. Instructions are located on the last page of this paperwork. If you have not heard from us regarding the results in 2 weeks, please contact this office.     Hypertension Hypertension is another name for high blood pressure. High blood pressure forces your heart to work harder to pump blood. This can cause problems over time. There are two numbers in a blood pressure reading. There is a top number (systolic) over a bottom number (diastolic). It is best to have a blood pressure below 120/80. Healthy choices can help lower your blood pressure. You may need medicine to help lower your blood pressure if:  Your blood pressure cannot be lowered with healthy choices.  Your blood pressure is higher than 130/80.  Follow these instructions at home: Eating and drinking  If directed, follow the DASH eating plan. This diet includes: ? Filling half of your plate at each meal with fruits and vegetables. ? Filling one quarter of your plate at each meal with whole grains. Whole grains include whole wheat pasta, brown rice, and whole grain bread. ? Eating or drinking low-fat dairy products, such as skim milk or low-fat yogurt. ? Filling one quarter of your plate at each meal with low-fat (lean) proteins. Low-fat proteins include fish, skinless chicken, eggs, beans, and tofu. ? Avoiding fatty meat, cured  and processed meat, or chicken with skin. ? Avoiding premade or processed food.  Eat less than 1,500 mg of salt (sodium) a day.  Limit alcohol use to no more than 1 drink a day for nonpregnant women and 2 drinks a day for men. One drink equals 12 oz of beer, 5 oz of wine, or 1 oz of hard liquor. Lifestyle  Work with your doctor to stay at a healthy weight or to lose weight. Ask your doctor what the best weight is for you.  Get at least 30 minutes of exercise that causes your heart to beat faster (aerobic exercise) most days of the week. This may include walking, swimming, or biking.  Get at least 30 minutes of exercise that strengthens your muscles (resistance exercise) at least 3 days a week. This may include lifting weights or pilates.  Do not use any products that contain nicotine or tobacco. This includes cigarettes and e-cigarettes. If you need help quitting, ask your doctor.  Check your blood pressure at home as told by your doctor.  Keep all follow-up visits as told by your doctor. This is important. Medicines  Take over-the-counter and prescription medicines only as told by your doctor. Follow directions carefully.  Do not skip doses of blood pressure medicine. The medicine does not work as well if you skip doses. Skipping doses also puts you at risk for problems.  Ask your doctor about side effects or reactions to medicines that you should watch for. Contact a doctor if:  You think you are having a reaction to the   medicine you are taking.  You have headaches that keep coming back (recurring).  You feel dizzy.  You have swelling in your ankles.  You have trouble with your vision. Get help right away if:  You get a very bad headache.  You start to feel confused.  You feel weak or numb.  You feel faint.  You get very bad pain in your: ? Chest. ? Belly (abdomen).  You throw up (vomit) more than once.  You have trouble breathing. Summary  Hypertension is  another name for high blood pressure.  Making healthy choices can help lower blood pressure. If your blood pressure cannot be controlled with healthy choices, you may need to take medicine. This information is not intended to replace advice given to you by your health care provider. Make sure you discuss any questions you have with your health care provider. Document Released: 11/18/2007 Document Revised: 04/29/2016 Document Reviewed: 04/29/2016 Elsevier Interactive Patient Education  2018 [employer]  T?ng huy?t p Hypertension T?ng huy?t p, th??ng ???c g?i l huy?t p cao, l khi l?c b?m mu qua ??ng m?ch c?a qu v? qu m?nh. ??ng m?ch c?a qu v? l cc m?ch mu mang mu t? tim ?i kh?p c? th?. T?ng huy?t p khi?n tim lm vi?c v?t v? h?n ?? b?m mu v c th? khi?n cc ??ng m?ch tr? ln h?p ho?c c?ng. T?ng huy?t p khng ???c ?i?u tr? ho?c khng ki?m sot ???c c th? d?n t?i nh?i mu c? tim, ??t qu?, b?nh th?n v nh?ng v?n ?? khc. Ch? s? ?o huy?t p g?m m?t ch? s? cao trn m?t ch? s? th?p. Huy?t a?p ly? t???ng cu?a quy? vi? la? d??i 120/80. Ch? s? ??u tin ("??nh") ???c g?i l huy?t p tm thu. ?y l s? ?o p su?t trong ??ng m?ch khi tim qu v? ??p. Ch? s? th? hai ("?y") ???c g?i l huy?t p tm tr??ng. ?y l s? ?o p su?t trong ??ng m?ch khi tim qu v? ngh?[employer]. Nguyn nhn g gy ra? Khng r nguyn nhn gy ra tnh tr?ng ny. ?i?u g lm t?ng nguy c?? M?t s? y?u t? nguy c? d?n ??n huy?t p cao c th? ki?m sot ???c. M?t s? y?u t? khc th khng. Nh?ng y?u t? qu v? c th? thay ??i  Ht thu?c.  B? b?nh ti?u ???ng tup 2, cholesterol cao, ho?c c? hai.  Khng t?p th? d?c ho?c cc ho?t ??ng th? ch?t ??y ??Marland Kitchen.  Th?a cn.  ?n qu nhi?u ch?t bo, ???ng, ca-lo, ho?c mu?i (Natri).  U?ng qu nhi?u r??u. Nh?ng y?u t? kh ho?c khng th? thay ??i  B?nh th?n m?n tnh.  C ti?n s? gia ?nh b? cao huy?t p.  ?? tu?i. Nguy c? t?ng ln theo ?? tu?i.  Ch?ng t?c. Qu v? c th? c nguy c? cao  h?n n?u qu v? l ng??i M? g?c Phi.  Gi?i tnh. Nam gi?i c nguy c? cao h?n ph? n? tr??c tu?i 45. Sau tu?i 65, ph? n? c nguy c? cao h?n nam gi?i.  Ng?ng th? do t?c ngh?n khi ng?.  C?ng th?ng. Cc d?u hi?u ho?c tri?u ch?ng l g? Huy?t p qu cao (c?n t?ng huy?t p) c th? gy ra:  ?au ??u.  Lo u.  Kh th?.  Ch?y mu cam.  Bu?n nn v nn.  ?au ng?c n?ng.  C? ??ng gi?t gi?t qu v? khng th? ki?m sot ???c (co gi?t).  Ch?n ?on tnh tr?ng ny nh? th? no?  Tnh tr?ng ny ???c ch?n ?on b?ng cch ?o huy?t p c?a qu v? lc qu v? ng?i, ?? tay trn m?t m?t ph?ng. B?ng qu?n thi?t b? ?o huy?t p s? ???c qu?n tr?c ti?pvo vng da cnh tay pha trn c?a qu v? ngang v?i m?c tim. Huy?t p c?n ???c ?o t nh?t hai l?n trn cng m?t cnh tay. M?t s? tnh tr?ng nh?t ??nh c th? lm cho huy?t p khc nhau gi?a tay ph?i v tay tri c?a qu v?. M?t s? y?u t? nh?t ??nh c th? khi?n ch? s? ?o huy?t p th?p h?n ho?c cao h?n so v?i bnh th??ng (t?ng) trong th?i gian ng?n:  Khi huy?t p c?a qu v? ? phng khm c?a chuyn gia ch?m [place] s?c kh?e cao h?n so v?i lc qu v? ? nh, hi?n t??ng ny ???c g?i l t?ng huy?t p o chong tr?ng. H?u h?t nh?ng ng??i b? tnh tr?ng ny ??u khng c?n dng thu?c.  Khi huy?t p c?a qu v? lc ? nh cao h?n so v?i lc qu v? ? phng khm chuyn gia ch?m [place] s?c kh?e, hi?n t??ng ny ???c g?i l t?ng huy?t p m?t n?. H?u h?t nh?ng ng??i b? tnh tr?ng ny ??u c th? c?n dng thu?c ?? ki?m sot huy?t p.  N?u qu v? c ch? s? huy?t p cao trong m?t l?n khm ho?c qu v? c huy?t p bnh th??ng c km cc y?u t? nguy c? khc:  Qu v? c th? ???c yu c?u tr? l?i vo m?t ngy khc ?? ki?m tra l?i huy?t p.  Qu v? c th? ???c yu c?u theo di huy?t p t?i nh trong vng 1 tu?n ho?c lu h?n.  N?u qu v? ???c ch?n ?on b? t?ng huy?t p, qu v? c th? c?n th?c hi?n cc xt nghi?m mu ho?c ki?m tra hnh ?nh khc ?? gip chuyn gia ch?m [place] s?c kh?e hi?u nguy c? t?ng th? m?c cc  b?nh tr?ng khc. Tnh tr?ng ny ???c ?i?u tr? nh? th? no? Tnh tr?ng ny ???c ?i?u tr? b?ng cch thay ??i l?i s?ng lnh m?nh, ch?ng h?nh nh? ?n th?c ph?m c l?i cho s?c kh?e, t?p th? d?c nhi?u h?n v gi?m l??ng r??u u?ng vo. N?u thay ??i l?i s?ng khng ?? ?? ??a huy?t p v? m?c c th? ki?m sot ???c, chuyn gia ch?m  s?c kh?e c th? k ??n thu?c, v n?u:  Huy?t p tm thu c?a qu v? trn 130.  Huy?t p tm tr??ng c?a qu v? trn 80.  Huy?t p m?c tiu c nhn c?a qu v? c th? khc nhau ty thu?c v tnh tr?ng b?nh l, tu?i v cc nhn t? khc. Tun th? nh?ng h??ng d?n ny ? nh: ?n v u?ng  ?n ch? ?? giu ch?t x? v kali v t natri, ???ng ph? gia v ch?t bo. M?t k? ho?ch ?n m?u c tn ch? ?? ?n DASH (Cch ti?p c?n ?n u?ng ?? gi?m t?ng huy?t p). ?n theo cch ny: ? ?n nhi?u tri cy v rau t??i. Vo m?i b?a ?n, c? g?ng dnh m?t n?a ??a cho tri cy v rau. ? ?n ng? c?c nguyn h?t, ch?ng h?n nh? m ?ng lm t? b?t m nguyn cm, ho?c bnh m nguyn h?t. Cho ngu? c?c nguyn ca?m va?o m?t ph?n t? ??a c?a quy? vi?. ? ?n ho?c hu?ng cc s?n ph?m t? s?a t bo, ch?ng h?n nh? s?a ? b? kem ho?c s?a chua t bo. ? [place] nh?ng  mi?ng th?t nhi?u m?, th?t ? qua ch? bi?n ho?c th?t ??p mu?i v th?t gia c?m c da. Dnh kho?ng m?t ph?n t? ??a c?a qu v? cho cc protein khng m?, ch?ng h?n nh? c, th?t g khng da, ??u, tr?ng, v ??u ph?. ? Trnh nh?ng th?c ph?m ch? bi?n ho?c lm s?n. Nh?ng th?c ph?m ny th??ng c nhi?u natri, ???ng ph? gia v ch?t bo h?n.  Gi?m l??ng dng natri hng ngy c?a qu v?. H?u h?t nh?ng ng??i b? t?ng huy?t p ??u nn ?n d??i 1.500 mg natri m?i ngy.  Gi?i h?n l??ng r??u qu v? u?ng khng qu 1 ly m?i ngy v?i ph? n? khng mang thai v 2 ly m?i ngy v?i nam gi?i. M?t ly t??ng ???ng v?i 12 ao-x? bia, 5 ao-x? r??u vang, ho?c 1 ao-x? r??u m?nh. L?i s?ng  H?p tc v?i chuyn gia ch?m [place] s?c kh?e c?a qu v? ?? duy tr tr?ng l??ng c? th? c l?i cho s?c kh?e ho?c gi?m cn. Hy  h?i xem tr?ng l??ng no l l t??ng cho qu v?.  Dnh t nh?t 30 pht ?? t?p th? d?c m c th? khi?n tim qu v? ??p nhanh h?n (t?p th? d?c nh?p ?i?u) h?u h?t cc ngy trong tu?n. Cc ho?t ??ng c th? bao g?m ?i b?, b?i, ho?c ??p xe.  Bao g?m bi t?p t?ng c??ng c? (bi t?p khng l?c), ch?ng h?n nh? bi t?p [place] ho?c nng t?, nh? m?t ph?n c?a thi quen luy?n t?p hng tu?n c?a qu v?. C? g?ng t?p nh?ng lo?i bi t?p ny trong vng 30 pht t?i thi?u 3 ngy m?t tu?n.  Khng s? d?ng b?t k? s?n ph?m no ch?a nicotine ho?c thu?c l, ch?ng ha?n nh? thu?c l d?ng ht v thu?c l ?i?n t?. N?u qu v? c?n gip ?? ?? cai thu?c, hy h?i chuyn gia ch?m [place] s?c kh?e.  Theo di huy?t p c?a qu v? t?i nh theo h??ng d?n c?a chuyn gia ch?m [place] s?c kh?e.  Tun th? t?t c? cc cu?c h?n khm l?i theo ch? d?n c?a chuyn gia ch?m [place] s?c kh?e. ?i?u ny c vai tr quan tr?ng. Thu?c  Ch? s? d?ng thu?c khng k ??n v thu?c k ??n theo ch? d?n c?a chuyn gia ch?m [place] s?c kh?e. Lm theo ch? d?n m?t cch c?n th?n. Thu?c ?i?u tr? huy?t p ph?i ???c dng theo ??n ? k.  Khng b? li?u thu?c huy?t p. B? li?u khi?n qu v? c nguy c? g?p ph?i cc v?n ?? v c th? lm cho thu?c gi?m hi?u qu?Marland Kitchen  Hy h?i chuyn gia ch?m [place] s?c kh?e c?a qu v? v? nh?ng tc d?ng ph? ho?c ph?n ?ng v?i thu?c m qu v? ph?i theo di. Hy lin l?c v?i chuyn gia ch?m [place] s?c kh?e n?u:  Qu v? ngh? qu v? c ph?n ?ng v?i thu?c ?ang dng.  Qu v? b? ?au ??u ti?p t?c tr? l?i (ti pht).  Qu v? c?m th?y chng m?t.  Qu v? b? s?ng ph ? m?t c chn.  Qu v? c v?n ?? v? th? l?c. Yu c?u tr? gip ngay l?p t?c n?u:  Qu v? b? ?au ??u n?ng ho?c l l?n.  Qu v? b? y?u b?t th??ng ho?c t b.  Quy? vi? ca?m th?y bi? ng?t.  Qu v? b? ?au r?t nhi?u ? ng?c ho?c b?ng.  Qu v? nn nhi?u l?n.  Qu v? b? kh th?. Tm t?t  T?ng huy?t p l khi  l?c b?m mu qua ??ng m?ch c?a qu v? qu m?nh. N?u tnh tr?ng ny khng ???c ki?m sot, n c th?  khi?n qu v? g?p ph?i nguy c? bi?n ch?ng nghim tr?ng.  Huy?t p m?c tiu c nhn c?a qu v? c th? khc nhau ty thu?c v tnh tr?ng b?nh l, tu?i v cc nhn t? khc. ??i v?i h?u h?t m?i ng??i, huy?t p bnh th??ng l d??i 120/80.  ?i?u tr? t?ng huy?t p b?ng cch thay ??i l?i s?ng, dng thu?c, ho?c k?t h?p c? hai. Thay ??i l?i s?ng bao g?m gi?m cn, ?n ch? ?? ?n c l?i cho s?c kh?e, t mu?i, t?p th? d?c nhi?u h?n v h?n ch? u?ng r??u. Thng tin ny khng nh?m m?c ?ch thay th? cho l?i khuyn m chuyn gia ch?m [place] s?c kh?e ni v?i qu v?. Hy b?o ??m qu v? ph?i th?o lu?n b?t k? v?n ?? g m qu v? c v?i chuyn gia ch?m [place] s?c kh?e c?a qu v?. Document Released: 06/01/2005 Document Revised: 05/13/2016 Document Reviewed: 05/13/2016 Elsevier Interactive Patient Education  2018 [employer].

## 2016-11-18 NOTE — Progress Notes (Signed)
Shawn Ray 66 y.o.   Chief Complaint  Patient presents with  . Follow-up    blood pressure per patient out of medicationx 1 month  . Medication Refill    amlodipine    HISTORY OF PRESENT ILLNESS: This is a 66 y.o. male complaining of high blood pressure; asymptomatic; off meds x 1 month. Had BW done 1 month ago and told it was normal. HPI   Prior to Admission medications   Medication Sig Start Date End Date Taking? Authorizing Provider  amLODipine (NORVASC) 10 MG tablet Take 10 mg by mouth daily.   Yes [provider]  ciprofloxacin (CIPRO) 500 MG tablet Take 1 tablet (500 mg total) by mouth 2 (two) times daily. Patient not taking: Reported on 11/18/2016 11/26/15   Renne Crigler, PA-C  metroNIDAZOLE (FLAGYL) 500 MG tablet Take 1 tablet (500 mg total) by mouth 3 (three) times daily. Patient not taking: Reported on 11/18/2016 11/26/15   Renne Crigler, PA-C    Not on File  There are no active problems to display for this patient.   Past Medical History:  Diagnosis Date  . Hypertension     History reviewed. No pertinent surgical history.  Social History   Social History  . Marital status: Married    Spouse name: N/A  . Number of children: N/A  . Years of education: N/A   Occupational History  . Not on file.   Social History Main Topics  . Smoking status: Current Every Day Smoker    Packs/day: 0.50    Years: 50.00    Types: Pipe  . Smokeless tobacco: Never Used  . Alcohol use No  . Drug use: No  . Sexual activity: Not Currently   Other Topics Concern  . Not on file   Social History Narrative  . No narrative on file    Family History  Problem Relation Age of Onset  . Diabetes Mother   . Diabetes Sister      Review of Systems  Constitutional: Negative.  Negative for chills and fever.  HENT: Negative.   Eyes: Negative.   Respiratory: Negative for cough and shortness of breath.   Cardiovascular: Negative for chest pain, palpitations, leg swelling  and PND.  Gastrointestinal: Negative.  Negative for abdominal pain, diarrhea, nausea and vomiting.  Genitourinary: Negative.  Negative for dysuria and hematuria.  Musculoskeletal: Negative.  Negative for back pain, myalgias and neck pain.  Skin: Negative.  Negative for rash.  Neurological: Negative for dizziness, sensory change, focal weakness and headaches.  All other systems reviewed and are negative.  Vitals:   11/18/16 1509  BP: (!) 166/81  Pulse: 68  Resp: 16  Temp: 98.3 F (36.8 C)     Physical Exam  Constitutional: He is oriented to person, place, and time. He appears well-developed and well-nourished.  HENT:  Head: Normocephalic and atraumatic.  Nose: Nose normal.  Mouth/Throat: Oropharynx is clear and moist. No oropharyngeal exudate.  Eyes: Conjunctivae and EOM are normal. Pupils are equal, round, and reactive to light.  Neck: Normal range of motion. Neck supple. No JVD present. No thyromegaly present.  Cardiovascular: Normal rate, regular rhythm, normal heart sounds and intact distal pulses.   Pulmonary/Chest: Effort normal and breath sounds normal.  Abdominal: Soft. Bowel sounds are normal. He exhibits no distension. There is no tenderness.  Musculoskeletal: Normal range of motion.  Lymphadenopathy:    He has no cervical adenopathy.  Neurological: He is alert and oriented to person, place, and time. No  sensory deficit. He exhibits normal muscle tone.  Skin: Skin is warm and dry. Capillary refill takes less than 2 seconds. No rash noted.  Psychiatric: He has a normal mood and affect. His behavior is normal.  Vitals reviewed.    ASSESSMENT & PLAN: Shawn Ray was seen today for follow-up and medication refill.  Diagnoses and all orders for this visit:  Essential hypertension  Other orders -     amLODipine (NORVASC) 10 MG tablet; Take 1 tablet (10 mg total) by mouth daily.    Patient Instructions       IF you received an x-ray today, you will receive an  invoice [facility]. Please contact [facility] at [phone] with questions or concerns regarding your invoice.   IF you received labwork today, you will receive an invoice from [facility]. Please contact [facility] at [phone] with questions or concerns regarding your invoice.   Our billing staff will not be able to assist you with questions regarding bills from these companies.  You will be contacted with the lab results as soon as they are available. The fastest way to get your results is to activate your My Chart account. Instructions are located on the last page of this paperwork. If you have not heard from us regarding the results in 2 weeks, please contact this office.     Hypertension Hypertension is another name for high blood pressure. High blood pressure forces your heart to work harder to pump blood. This can cause problems over time. There are two numbers in a blood pressure reading. There is a top number (systolic) over a bottom number (diastolic). It is best to have a blood pressure below 120/80. Healthy choices can help lower your blood pressure. You may need medicine to help lower your blood pressure if:  Your blood pressure cannot be lowered with healthy choices.  Your blood pressure is higher than 130/80.  Follow these instructions at home: Eating and drinking  If directed, follow the DASH eating plan. This diet includes: ? Filling half of your plate at each meal with fruits and vegetables. ? Filling one quarter of your plate at each meal with whole grains. Whole grains include whole wheat pasta, brown rice, and whole grain bread. ? Eating or drinking low-fat dairy products, such as skim milk or low-fat yogurt. ? Filling one quarter of your plate at each meal with low-fat (lean) proteins. Low-fat proteins include fish, skinless chicken, eggs, beans, and tofu. ? Avoiding fatty meat, cured and processed meat, or chicken with skin. ? Avoiding  premade or processed food.  Eat less than 1,500 mg of salt (sodium) a day.  Limit alcohol use to no more than 1 drink a day for nonpregnant women and 2 drinks a day for men. One drink equals 12 oz of beer, 5 oz of wine, or 1 oz of hard liquor. Lifestyle  Work with your doctor to stay at a healthy weight or to lose weight. Ask your doctor what the best weight is for you.  Get at least 30 minutes of exercise that causes your heart to beat faster (aerobic exercise) most days of the week. This may include walking, swimming, or biking.  Get at least 30 minutes of exercise that strengthens your muscles (resistance exercise) at least 3 days a week. This may include lifting weights or pilates.  Do not use any products that contain nicotine or tobacco. This includes cigarettes and e-cigarettes. If you need help quitting, ask your doctor.  Check your  blood pressure at home as told by your doctor.  Keep all follow-up visits as told by your doctor. This is important. Medicines  Take over-the-counter and prescription medicines only as told by your doctor. Follow directions carefully.  Do not skip doses of blood pressure medicine. The medicine does not work as well if you skip doses. Skipping doses also puts you at risk for problems.  Ask your doctor about side effects or reactions to medicines that you should watch for. Contact a doctor if:  You think you are having a reaction to the medicine you are taking.  You have headaches that keep coming back (recurring).  You feel dizzy.  You have swelling in your ankles.  You have trouble with your vision. Get help right away if:  You get a very bad headache.  You start to feel confused.  You feel weak or numb.  You feel faint.  You get very bad pain in your: ? Chest. ? Belly (abdomen).  You throw up (vomit) more than once.  You have trouble breathing. Summary  Hypertension is another name for high blood pressure.  Making healthy  choices can help lower blood pressure. If your blood pressure cannot be controlled with healthy choices, you may need to take medicine. This information is not intended to replace advice given to you by your health care provider. Make sure you discuss any questions you have with your health care provider. Document Released: 11/18/2007 Document Revised: 04/29/2016 Document Reviewed: 04/29/2016 Elsevier Interactive Patient Education  2018 Elsevier Inc.  T?ng huy?t p Hypertension T?ng huy?t p, th??ng ???c g?i l huy?t p cao, l khi l?c b?m mu qua ??ng m?ch c?a qu v? qu m?nh. ??ng m?ch c?a qu v? l cc m?ch mu mang mu t? tim ?i kh?p c? th?. T?ng huy?t p khi?n tim lm vi?c v?t v? h?n ?? b?m mu v c th? khi?n cc ??ng m?ch tr? ln h?p ho?c c?ng. T?ng huy?t p khng ???c ?i?u tr? ho?c khng ki?m sot ???c c th? d?n t?i nh?i mu c? tim, ??t qu?, b?nh th?n v nh?ng v?n ?? khc. Ch? s? ?o huy?t p g?m m?t ch? s? cao trn m?t ch? s? th?p. Huy?t a?p ly? t???ng cu?a quy? vi? la? d??i 120/80. Ch? s? ??u tin ("??nh") ???c g?i l huy?t p tm thu. ?y l s? ?o p su?t trong ??ng m?ch khi tim qu v? ??p. Ch? s? th? hai ("?y") ???c g?i l huy?t p tm tr??ng. ?y l s? ?o p su?t trong ??ng m?ch khi tim qu v? ngh?[place] nhn g gy ra? Khng r nguyn nhn gy ra tnh tr?ng ny. ?i?u g lm t?ng nguy c?? M?t s? y?u t? nguy c? d?n ??n huy?t p cao c th? ki?m sot ???c. M?t s? y?u t? khc th khng. Nh?ng y?u t? qu v? c th? thay ??i  Ht thu?c.  B? b?nh ti?u ???ng tup 2, cholesterol cao, ho?c c? hai.  Khng t?p th? d?c ho?c cc ho?t ??ng th? ch?t ??y ??Marland Kitchen  Th?a cn.  ?n qu nhi?u ch?t bo, ???ng, ca-lo, ho?c mu?i (Natri).  U?ng qu nhi?u r??u. Nh?ng y?u t? kh ho?c khng th? thay ??i  B?nh th?n m?n tnh.  C ti?n s? gia ?nh b? cao huy?t p.  ?? tu?i. Nguy c? t?ng ln theo ?? tu?i.  Ch?ng t?c. Qu v? c th? c nguy c? cao h?n n?u qu v? l ng??i M? g?c Phi.  Gi?i tnh. Nam gi?i  c  nguy c? cao h?n ph? n? tr??c tu?i 45. Sau tu?i 65, ph? n? c nguy c? cao h?n nam gi?i.  Ng?ng th? do t?c ngh?n khi ng?.  C?ng th?ng. Cc d?u hi?u ho?c tri?u ch?ng l g? Huy?t p qu cao (c?n t?ng huy?t p) c th? gy ra:  ?au ??u.  Lo u.  Kh th?.  Ch?y mu cam.  Bu?n nn v nn.  ?au ng?c n?ng.  C? ??ng gi?t gi?t qu v? khng th? ki?m sot ???c (co gi?t).  Ch?n ?on tnh tr?ng ny nh? th? no? Tnh tr?ng ny ???c ch?n ?on b?ng cch ?o huy?t p c?a qu v? lc qu v? ng?i, ?? tay trn m?t m?t ph?ng. B?ng qu?n thi?t b? ?o huy?t p s? ???c qu?n tr?c ti?pvo vng da cnh tay pha trn c?a qu v? ngang v?i m?c tim. Huy?t p c?n ???c ?o t nh?t hai l?n trn cng m?t cnh tay. M?t s? tnh tr?ng nh?t ??nh c th? lm cho huy?t p khc nhau gi?a tay ph?i v tay tri c?a qu v?. M?t s? y?u t? nh?t ??nh c th? khi?n ch? s? ?o huy?t p th?p h?n ho?c cao h?n so v?i bnh th??ng (t?ng) trong th?i gian ng?n:  Khi huy?t p c?a qu v? ? phng khm c?a chuyn gia ch?m [place] s?c kh?e cao h?n so v?i lc qu v? ? nh, hi?n t??ng ny ???c g?i l t?ng huy?t p o chong tr?ng. H?u h?t nh?ng ng??i b? tnh tr?ng ny ??u khng c?n dng thu?c.  Khi huy?t p c?a qu v? lc ? nh cao h?n so v?i lc qu v? ? phng khm chuyn gia ch?m [place] s?c kh?e, hi?n t??ng ny ???c g?i l t?ng huy?t p m?t n?. H?u h?t nh?ng ng??i b? tnh tr?ng ny ??u c th? c?n dng thu?c ?? ki?m sot huy?t p.  N?u qu v? c ch? s? huy?t p cao trong m?t l?n khm ho?c qu v? c huy?t p bnh th??ng c km cc y?u t? nguy c? khc:  Qu v? c th? ???c yu c?u tr? l?i vo m?t ngy khc ?? ki?m tra l?i huy?t p.  Qu v? c th? ???c yu c?u theo di huy?t p t?i nh trong vng 1 tu?n ho?c lu h?n.  N?u qu v? ???c ch?n ?on b? t?ng huy?t p, qu v? c th? c?n th?c hi?n cc xt nghi?m mu ho?c ki?m tra hnh ?nh khc ?? gip chuyn gia ch?m [place] s?c kh?e hi?u nguy c? t?ng th? m?c cc b?nh tr?ng khc. Tnh tr?ng ny ???c ?i?u tr? nh? th?  no? Tnh tr?ng ny ???c ?i?u tr? b?ng cch thay ??i l?i s?ng lnh m?nh, ch?ng h?nh nh? ?n th?c ph?m c l?i cho s?c kh?e, t?p th? d?c nhi?u h?n v gi?m l??ng r??u u?ng vo. N?u thay ??i l?i s?ng khng ?? ?? ??a huy?t p v? m?c c th? ki?m sot ???c, chuyn gia ch?m [place] s?c kh?e c th? k ??n thu?c, v n?u:  Huy?t p tm thu c?a qu v? trn 130.  Huy?t p tm tr??ng c?a qu v? trn 80.  Huy?t p m?c tiu c nhn c?a qu v? c th? khc nhau ty thu?c v tnh tr?ng b?nh l, tu?i v cc nhn t? khc. Tun th? nh?ng h??ng d?n ny ? nh: ?n v u?ng  ?n ch? ?? giu ch?t x? v kali v t natri, ???ng ph? gia v ch?t bo. M?t k? ho?ch ?n m?u c tn ch? ?? ?n DASH (Cch  ti?p c?n ?n u?ng ?? gi?m t?ng huy?t p). ?n theo cch ny: ? ?n nhi?u tri cy v rau t??i. Vo m?i b?a ?n, c? g?ng dnh m?t n?a ??a cho tri cy v rau. ? ?n ng? c?c nguyn h?t, ch?ng h?n nh? m ?ng lm t? b?t m nguyn cm, ho?c bnh m nguyn h?t. Cho ngu? c?c nguyn ca?m va?o m?t ph?n t? ??a c?a quy? vi?. ? ?n ho?c hu?ng cc s?n ph?m t? s?a t bo, ch?ng h?n nh? s?a ? b? kem ho?c s?a chua t bo. ? Trnh nh?ng mi?ng th?t nhi?u m?, th?t ? qua ch? bi?n ho?c th?t ??p mu?i v th?t gia c?m c da. Dnh kho?ng m?t ph?n t? ??a c?a qu v? cho cc protein khng m?, ch?ng h?n nh? c, th?t g khng da, ??u, tr?ng, v ??u ph?. ? Trnh nh?ng th?c ph?m ch? bi?n ho?c lm s?n. Nh?ng th?c ph?m ny th??ng c nhi?u natri, ???ng ph? gia v ch?t bo h?n.  Gi?m l??ng dng natri hng ngy c?a qu v?. H?u h?t nh?ng ng??i b? t?ng huy?t p ??u nn ?n d??i 1.500 mg natri m?i ngy.  Gi?i h?n l??ng r??u qu v? u?ng khng qu 1 ly m?i ngy v?i ph? n? khng mang thai v 2 ly m?i ngy v?i nam gi?i. M?t ly t??ng ???ng v?i 12 ao-x? bia, 5 ao-x? r??u vang, ho?c 1 ao-x? r??u m?nh. L?i s?ng  H?p tc v?i chuyn gia ch?m [place] s?c kh?e c?a qu v? ?? duy tr tr?ng l??ng c? th? c l?i cho s?c kh?e ho?c gi?m cn. Hy h?i xem tr?ng l??ng no l l t??ng cho qu v?.  Dnh  t nh?t 30 pht ?? t?p th? d?c m c th? khi?n tim qu v? ??p nhanh h?n (t?p th? d?c nh?p ?i?u) h?u h?t cc ngy trong tu?n. Cc ho?t ??ng c th? bao g?m ?i b?, b?i, ho?c ??p xe.  Bao g?m bi t?p t?ng c??ng c? (bi t?p khng l?c), ch?ng h?n nh? bi t?p [place] ho?c nng t?, nh? m?t ph?n c?a thi quen luy?n t?p hng tu?n c?a qu v?. C? g?ng t?p nh?ng lo?i bi t?p ny trong vng 30 pht t?i thi?u 3 ngy m?t tu?n.  Khng s? d?ng b?t k? s?n ph?m no ch?a nicotine ho?c thu?c l, ch?ng ha?n nh? thu?c l d?ng ht v thu?c l ?i?n t?. N?u qu v? c?n gip ?? ?? cai thu?c, hy h?i chuyn gia ch?m [place] s?c kh?e.  Theo di huy?t p c?a qu v? t?i nh theo h??ng d?n c?a chuyn gia ch?m [place] s?c kh?e.  Tun th? t?t c? cc cu?c h?n khm l?i theo ch? d?n c?a chuyn gia ch?m [place] s?c kh?e. ?i?u ny c vai tr quan tr?ng. Thu?c  Ch? s? d?ng thu?c khng k ??n v thu?c k ??n theo ch? d?n c?a chuyn gia ch?m [place] s?c kh?e. Lm theo ch? d?n m?t cch c?n th?n. Thu?c ?i?u tr? huy?t p ph?i ???c dng theo ??n ? k.  Khng b? li?u thu?c huy?t p. B? li?u khi?n qu v? c nguy c? g?p ph?i cc v?n ?? v c th? lm cho thu?c gi?m hi?u qu?Marland Kitchen  Hy h?i chuyn gia ch?m [place] s?c kh?e c?a qu v? v? nh?ng tc d?ng ph? ho?c ph?n ?ng v?i thu?c m qu v? ph?i theo di. Hy lin l?c v?i chuyn gia ch?m [place] s?c kh?e n?u:  Qu v? ngh? qu v? c ph?n ?ng v?i thu?c ?ang dng.  Qu v? b? ?au ??u ti?p  t?c tr? l?i (ti pht).  Qu v? c?m th?y chng m?t.  Qu v? b? s?ng ph ? m?t c chn.  Qu v? c v?n ?? v? th? l?c. Yu c?u tr? gip ngay l?p t?c n?u:  Qu v? b? ?au ??u n?ng ho?c l l?n.  Qu v? b? y?u b?t th??ng ho?c t b.  Quy? vi? ca?m th?y bi? ng?t.  Qu v? b? ?au r?t nhi?u ? ng?c ho?c b?ng.  Qu v? nn nhi?u l?n.  Qu v? b? kh th?. Tm t?t  T?ng huy?t p l khi l?c b?m mu qua ??ng m?ch c?a qu v? qu m?nh. N?u tnh tr?ng ny khng ???c ki?m sot, n c th? khi?n qu v? g?p ph?i nguy c? bi?n ch?ng nghim  tr?ng.  Huy?t p m?c tiu c nhn c?a qu v? c th? khc nhau ty thu?c v tnh tr?ng b?nh l, tu?i v cc nhn t? khc. ??i v?i h?u h?t m?i ng??i, huy?t p bnh th??ng l d??i 120/80.  ?i?u tr? t?ng huy?t p b?ng cch thay ??i l?i s?ng, dng thu?c, ho?c k?t h?p c? hai. Thay ??i l?i s?ng bao g?m gi?m cn, ?n ch? ?? ?n c l?i cho s?c kh?e, t mu?i, t?p th? d?c nhi?u h?n v h?n ch? u?ng r??u. Thng tin ny khng nh?m m?c ?ch thay th? cho l?i khuyn m chuyn gia ch?m [place] s?c kh?e ni v?i qu v?. Hy b?o ??m qu v? ph?i th?o lu?n b?t k? v?n ?? g m qu v? c v?i chuyn gia ch?m [place] s?c kh?e c?a qu v?. Document Released: 06/01/2005 Document Revised: 05/13/2016 Document Reviewed: 05/13/2016 Elsevier Interactive Patient Education  2018 Elsevier Inc.    Edwina Barth, MD [facility]

## 2016-11-29 IMAGING — CT CT ABD-PELV W/ CM
2 of 5 series · 16 of 46 positions shown, 18 images · IV contrast (Omni 300)
Comparison: None.

CLINICAL DATA: Mid to lower abdominal pain since yesterday. Nausea
and vomiting. Periumbilical pain.

EXAM:
CT ABDOMEN AND PELVIS WITH CONTRAST
TECHNIQUE: Multidetector CT imaging of the abdomen and pelvis was performed
using the standard protocol following bolus administration of
intravenous contrast.
CONTRAST:  100mL 25T4RS-QXX IOPAMIDOL (25T4RS-QXX) INJECTION 61%

[Series 2: a/p w/ 5mm · axial · 0.75mm/px · z∈[-523,-98]mm · 13 of 95 slices shown, 15 images]
[im 5/95  soft-tissue]
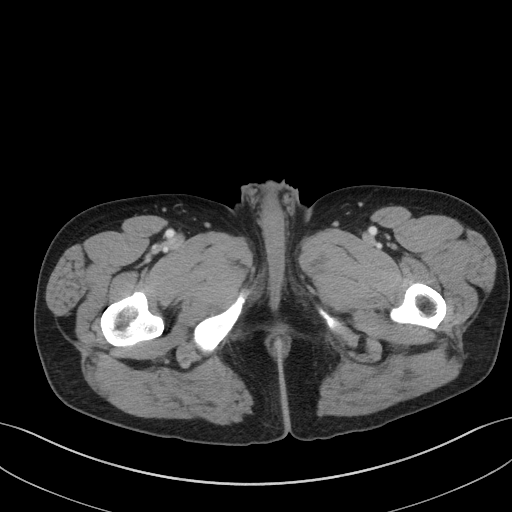
[im 5/95  bone]
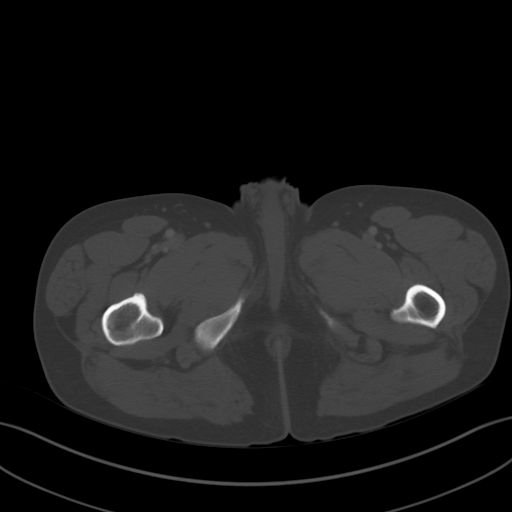
[im 15/95  soft-tissue]
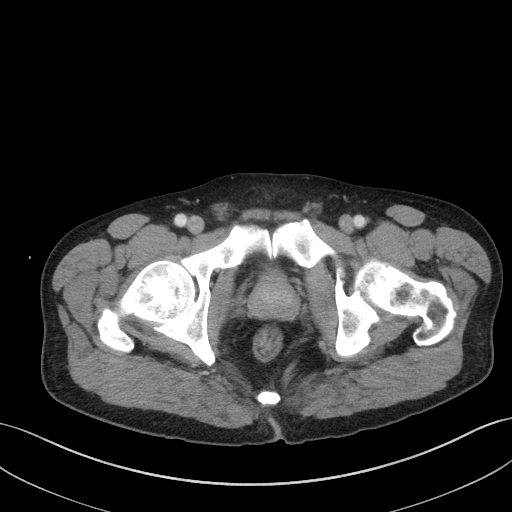
[im 20/95  soft-tissue]
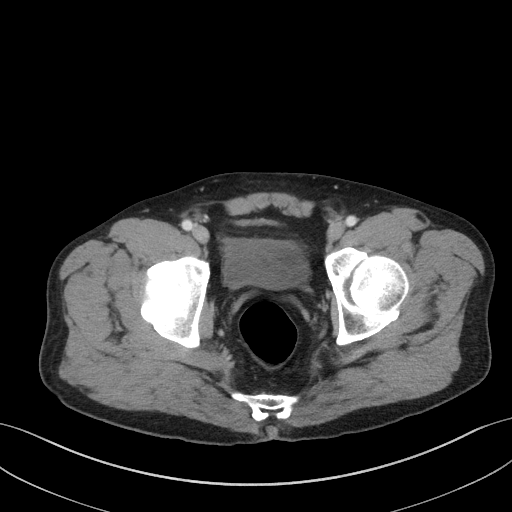
[im 25/95  soft-tissue]
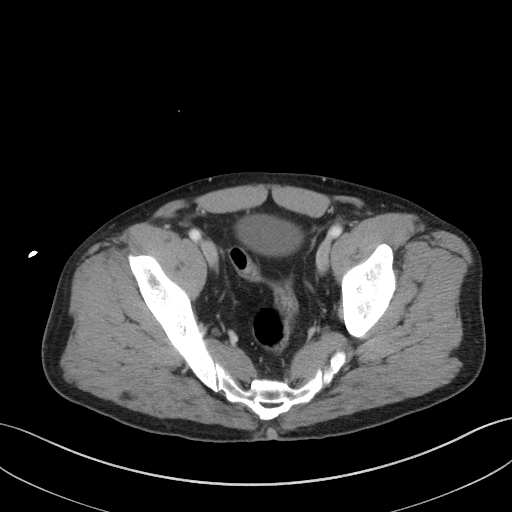
[im 35/95  soft-tissue]
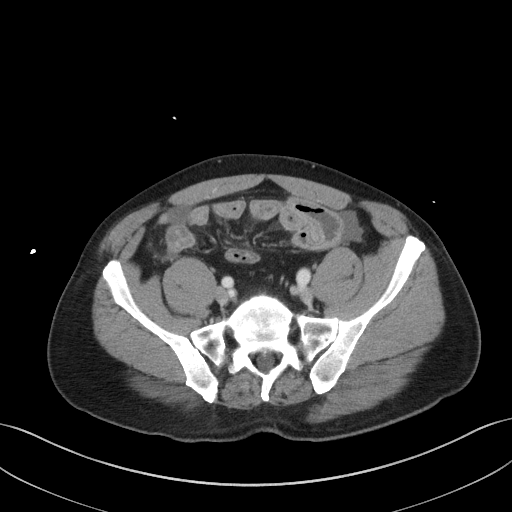
[im 40/95  soft-tissue]
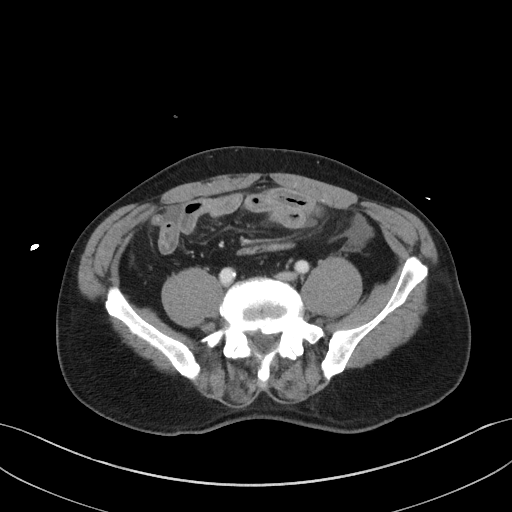
[im 50/95  soft-tissue]
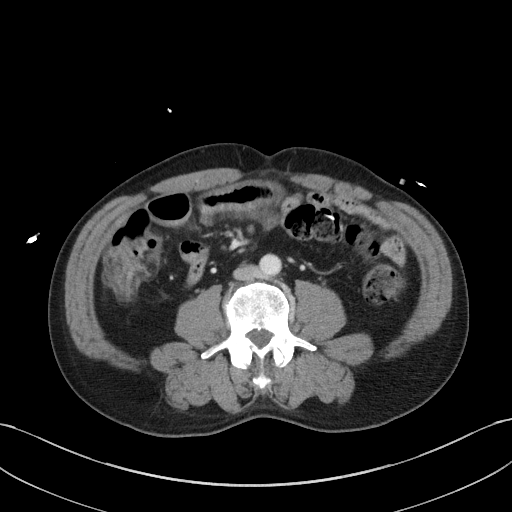
[im 55/95  soft-tissue]
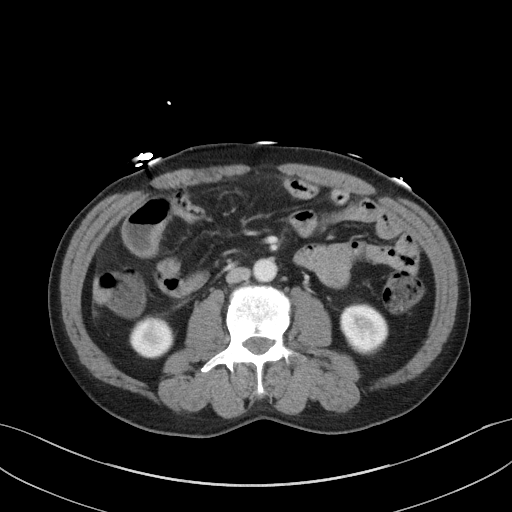
[im 60/95  soft-tissue]
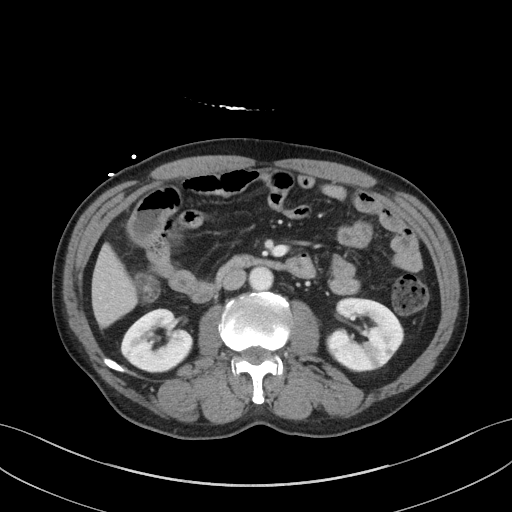
[im 60/95  bone]
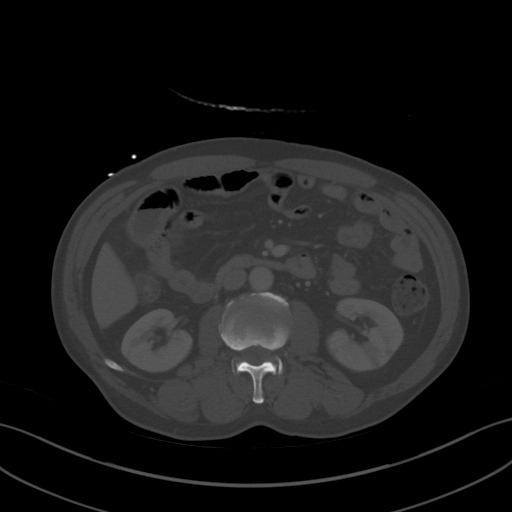
[im 70/95  soft-tissue]
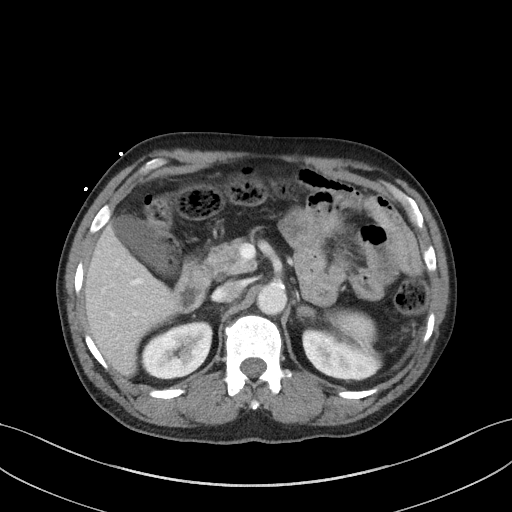
[im 75/95  soft-tissue]
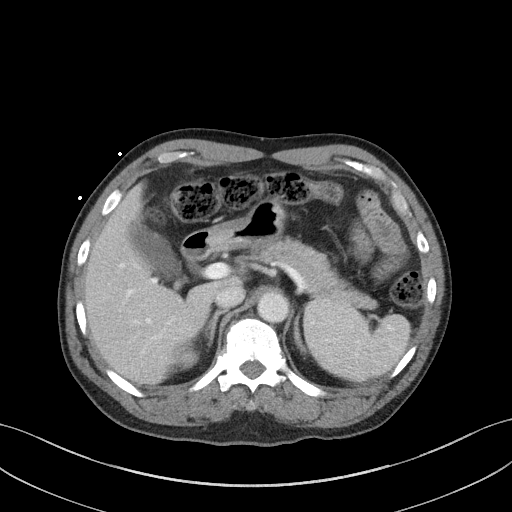
[im 80/95  soft-tissue]
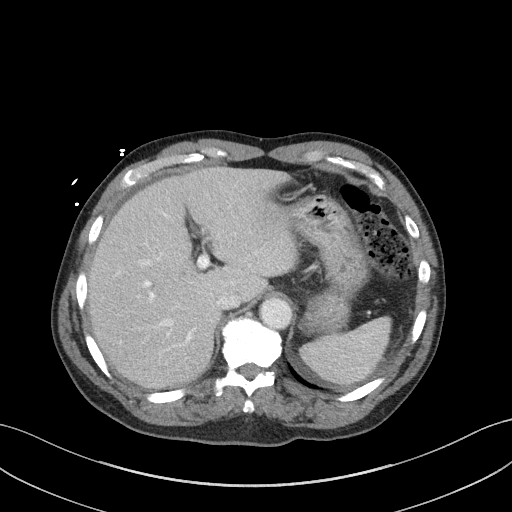
[im 90/95  soft-tissue]
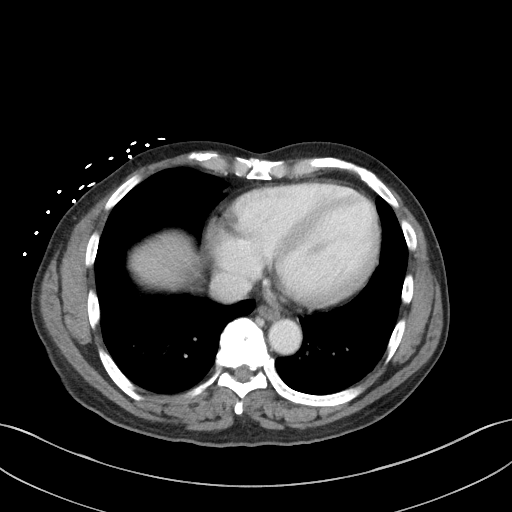

[Series 5: a/p w/ cor · coronal · 0.74mm/px · 3 of 111 slices shown]
[im 37/111  soft-tissue]
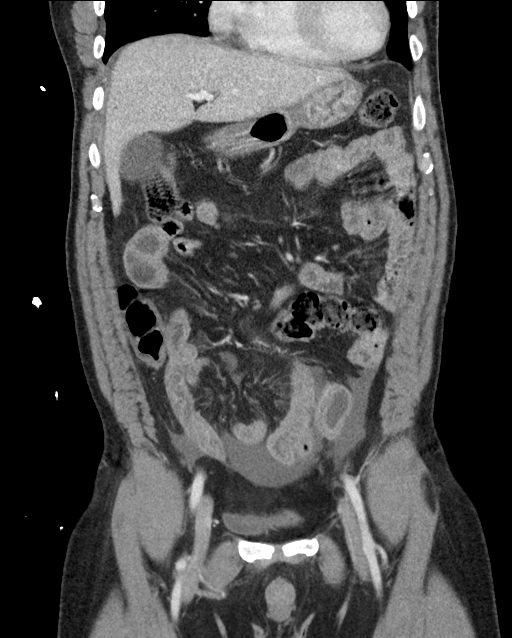
[im 49/111  soft-tissue]
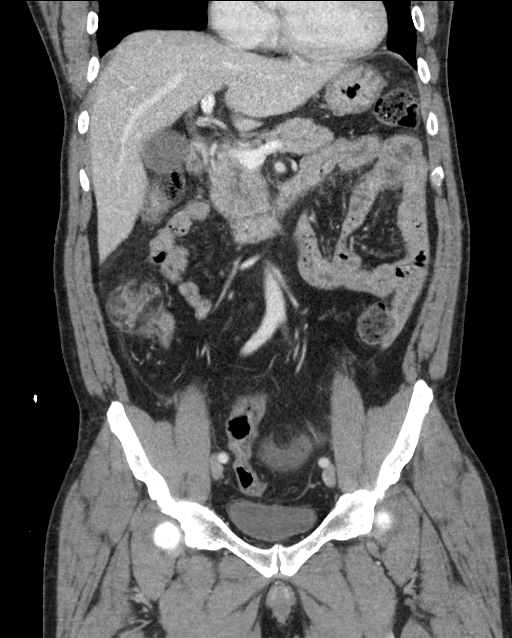
[im 62/111  soft-tissue]
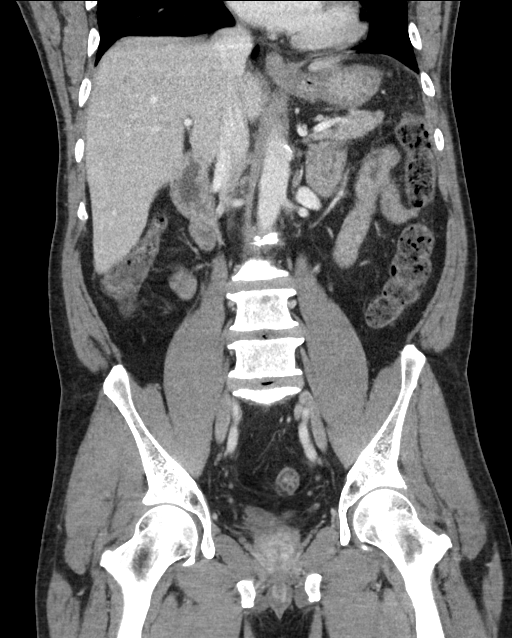

[16 of 46 positions shown; findings below may reference images not displayed]

FINDINGS: Dependent changes in the lung bases.

Small amount of free fluid in the upper abdomen around the liver and
spleen. The liver, spleen, gallbladder, pancreas, adrenal glands,
kidneys, abdominal aorta, inferior vena cava, and retroperitoneal
lymph nodes are unremarkable. Stomach, small bowel, and colon are
not abnormally distended. There is diffuse wall thickening of the
distal colon extending from the lower pelvic loops to the terminal
ileum. There is fluid in the mesentery around the abnormal bowel
loops. Changes are consistent with enteritis in could represent
infectious or inflammatory causes. Crohn's disease could have this
appearance. No free air in the abdomen.

Pelvis: Borderline prostate gland at 3.7 cm. Bladder wall is not
thickened. No pelvic mass or lymphadenopathy. Appendix is normal.
Degenerative changes in the spine. No destructive bone lesions.
IMPRESSION: Diffuse wall thickening in the terminal ileum and pelvic small bowel
loops with associated mesenteric fluid. Changes are likely due to
enteritis of nonspecific etiology. Free fluid in the abdomen and
pelvis is likely reactive.

## 2017-05-21 ENCOUNTER — Ambulatory Visit: Payer: BLUE CROSS/BLUE SHIELD | Admitting: Emergency Medicine

## 2017-10-18 ENCOUNTER — Ambulatory Visit (INDEPENDENT_AMBULATORY_CARE_PROVIDER_SITE_OTHER): Payer: BLUE CROSS/BLUE SHIELD | Admitting: Diagnostic Neuroimaging

## 2017-10-18 ENCOUNTER — Encounter: Payer: Self-pay | Admitting: Diagnostic Neuroimaging

## 2017-10-18 VITALS — BP 174/94 | HR 81 | Ht 64.0 in | Wt 151.0 lb

## 2017-10-18 DIAGNOSIS — R29898 Other symptoms and signs involving the musculoskeletal system: Secondary | ICD-10-CM | POA: Diagnosis not present

## 2017-10-18 DIAGNOSIS — R202 Paresthesia of skin: Secondary | ICD-10-CM

## 2017-10-18 DIAGNOSIS — R200 Anesthesia of skin: Secondary | ICD-10-CM

## 2017-10-18 MED ORDER — GABAPENTIN 300 MG PO CAPS: 300.0000 mg | ORAL_CAPSULE | Freq: Two times a day (BID) | ORAL | 6 refills | Status: AC

## 2017-10-18 NOTE — Patient Instructions (Signed)
-   MRI brain (rule out stroke)  - MRI cervical spine (rule out pinched nerve)  - gabapentin  at bedtime; may increase to twice a day   - follow up with primary care doctor re: hypertension (high blood pressure)

## 2017-10-18 NOTE — Progress Notes (Signed)
[facility]  PATIENT: Shawn Ray DOB: 06/11/1951  REFERRING CLINICIAN: Lue HISTORY FROM: patient and daughter (via Nurse, learning disability) REASON FOR VISIT: new consult    HISTORICAL  CHIEF COMPLAINT:  Chief Complaint  Patient presents with  . New Patient (Initial Visit)    neck pain/numbness; pain extends down his Left shoulder. He also reports numbness  . Referral    Dr. Demetrio Lapping  . Room 6    He is here with [place] interpreter & his daughter    HISTORY OF PRESENT ILLNESS:   67 year old male here for evaluation of neck pain and left arm numbness.  Symptoms started 1 month ago.  He has some numbness rating to the right hand.  He is also having weakness in his left hand.  No prodromal accidents, injuries, falls.  No problem with his left face or left leg.   REVIEW OF SYSTEMS: Full 14 system review of systems performed and negative with exception of: Ringing in ears blurred vision headache snoring shortness of breath.  ALLERGIES: Not on File  HOME MEDICATIONS: Outpatient Medications Prior to Visit  Medication Sig Dispense Refill  . amLODipine (NORVASC) 10 MG tablet Take 1 tablet (10 mg total) by mouth daily. 90 tablet 3  . HYDROcodone-acetaminophen (NORCO/VICODIN) 5-325 MG tablet Take 1 tablet by mouth every 8 (eight) hours as needed.    Marland Kitchen lisinopril-hydrochlorothiazide (PRINZIDE,ZESTORETIC) 20-12.5 MG tablet Take 1 tablet by mouth daily.    . naproxen (NAPROSYN) 500 MG tablet Take 500 mg by mouth 2 (two) times daily with a meal.    . ciprofloxacin (CIPRO) 500 MG tablet Take 1 tablet (500 mg total) by mouth 2 (two) times daily. (Patient not taking: Reported on 11/18/2016) 14 tablet 0  . metroNIDAZOLE (FLAGYL) 500 MG tablet Take 1 tablet (500 mg total) by mouth 3 (three) times daily. (Patient not taking: Reported on 11/18/2016) 21 tablet 0   No facility-administered medications prior to visit.     PAST MEDICAL HISTORY: Past Medical History:  Diagnosis Date  . Hypertension       PAST SURGICAL HISTORY: History reviewed. No pertinent surgical history.  FAMILY HISTORY: Family History  Problem Relation Age of Onset  . Diabetes Mother   . Other Mother        neck pain and arm sensory changes  . Diabetes Sister     SOCIAL HISTORY:  Social History   Socioeconomic History  . Marital status: Married    Spouse name: Not on file  . Number of children: 4  . Years of education: Not on file  . Highest education level: 7th grade  Occupational History  . Not on file  Social Needs  . Financial resource strain: Not on file  . Food insecurity:    Worry: Not on file    Inability: Not on file  . Transportation needs:    Medical: Not on file    Non-medical: Not on file  Tobacco Use  . Smoking status: Current Every Day Smoker    Packs/day: 0.50    Years: 50.00    Pack years: 25.00    Types: Pipe  . Smokeless tobacco: Never Used  Substance and Sexual Activity  . Alcohol use: No  . Drug use: No  . Sexual activity: Not Currently  Lifestyle  . Physical activity:    Days per week: Not on file    Minutes per session: Not on file  . Stress: Not on file  Relationships  . Social connections:  Talks on phone: Not on file    Gets together: Not on file    Attends religious service: Not on file    Active member of club or organization: Not on file    Attends meetings of clubs or organizations: Not on file    Relationship status: Not on file  . Intimate partner violence:    Fear of current or ex partner: Not on file    Emotionally abused: Not on file    Physically abused: Not on file    Forced sexual activity: Not on file  Other Topics Concern  . Not on file  Social History Narrative   Lives at home with his wife and children   Right handed   Caffeine: 1 cup of coffee in the morning     PHYSICAL EXAM  GENERAL EXAM/CONSTITUTIONAL: Vitals:  Vitals:   10/18/17 1623  BP: (!) 174/94  Pulse: 81  Weight: 151 lb (68.5 kg)  Height:  (1.626 m)      Body mass index is 25.92 kg/m.  No exam data present  Patient is in no distress; well developed, nourished and groomed; neck is supple  CARDIOVASCULAR:  Examination of carotid arteries is normal; no carotid bruits  Regular rate and rhythm, no murmurs  Examination of peripheral vascular system by observation and palpation is normal  EYES:  Ophthalmoscopic exam of optic discs and posterior segments is normal; no papilledema or hemorrhages  MUSCULOSKELETAL:  Gait, strength, tone, movements noted in Neurologic exam below  NEUROLOGIC: MENTAL STATUS:  No flowsheet data found.  awake, alert, oriented to person, place and time  recent and remote memory intact  normal attention and concentration  language fluent, comprehension intact, naming intact,   fund of knowledge appropriate  CRANIAL NERVE:   2nd - no papilledema on fundoscopic exam  2nd, 3rd, 4th, 6th - pupils equal and reactive to light, visual fields full to confrontation, extraocular muscles intact, no nystagmus  5th - facial sensation symmetric  7th - facial strength --> DECR PP IN LEFT FACE   8th - hearing intact  9th - palate elevates symmetrically, uvula midline  11th - shoulder shrug symmetric  12th - tongue protrusion midline  MOTOR:   normal bulk and tone  RUE AND BLE 5/5  LUE (DELTOID 4, BICEPS 4, TRICEP 4, GRIP 5); LIMITED BY PAIN  SENSORY:   normal and symmetric to light touch  DECR PP, TEMP AND VIB IN LEFT HAND  DECR VIB AN TEMP IN BILATERAL FEET, pinprick, temperature, vibration  COORDINATION:   finger-nose-finger, fine finger movements normal  REFLEXES:   deep tendon reflexes TRACE and symmetric  GAIT/STATION:   narrow based gait    DIAGNOSTIC DATA (LABS, IMAGING, TESTING) - I reviewed patient records, labs, notes, testing and imaging myself where available.  Lab Results  Component Value Date   WBC 9.3 11/25/2015   HGB 12.6 (L) 11/25/2015   HCT 39.9  11/25/2015   MCV 73.6 (L) 11/25/2015   PLT 239 11/25/2015      Component Value Date/Time   NA 138 11/25/2015 1743   K 4.3 11/25/2015 1743   CL 103 11/25/2015 1743   CO2 27 11/25/2015 1743   GLUCOSE 109 (H) 11/25/2015 1743   BUN 11 11/25/2015 1743   CREATININE 1.04 11/25/2015 1743   CALCIUM 9.5 11/25/2015 1743   PROT 7.4 11/25/2015 1743   ALBUMIN 4.2 11/25/2015 1743   AST 23 11/25/2015 1743   ALT 47 11/25/2015 1743   ALKPHOS  71 11/25/2015 1743   BILITOT 1.1 11/25/2015 1743   GFRNONAA >60 11/25/2015 1743   GFRAA >60 11/25/2015 1743   No results found for: CHOL, HDL, LDLCALC, LDLDIRECT, TRIG, CHOLHDL No results found for: ZHYQ6V No results found for: VITAMINB12 No results found for: TSH     ASSESSMENT AND PLAN  67 y.o. year old male here with neck pain rating to the left arm.  Neurologic exam notable for decreased sensation left face and left arm with some weakness in the left arm.  Signs and symptoms concerning for right brain stroke.  Cervical radicular apathy also consideration.  Ddx: stroke vs cervical radiculopathy  1. Left arm weakness   2. Numbness and tingling of left side of face      PLAN:  - MRI brain (rule out stroke) - MRI cervical spine (rule out cervical radiculopathy) - gabapentin  at bedtime for pain / numbness; may increase to twice a day - may consider physical therapy - follow up with PCP re: hypertension  Orders Placed This Encounter  Procedures  . MR BRAIN WO CONTRAST  . MR CERVICAL SPINE WO CONTRAST   Meds ordered this encounter  Medications  . gabapentin (NEURONTIN) 300 MG capsule    Sig: Take 1 capsule (300 mg total) by mouth 2 (two) times daily.    Dispense:  60 capsule    Refill:  6   Return in about 6 months (around 04/20/2018).  I reviewed images, labs, notes, records myself. I summarized findings and reviewed with patient, for this high risk condition (possible stroke) requiring high complexity decision making.     Suanne Marker, MD 10/18/2017, 4:32 PM Certified in Neurology, Neurophysiology and Neuroimaging  [facility] [address] [place], [place] [address] [phone]

## 2017-10-20 ENCOUNTER — Telehealth: Payer: Self-pay | Admitting: Diagnostic Neuroimaging

## 2017-10-20 NOTE — Telephone Encounter (Signed)
The MRI's are pending with [facility] I faxed clinical notes to [phone] and the tracking number is [id number].

## 2017-10-21 NOTE — Telephone Encounter (Signed)
I called the check the status on the tracking number they said they did receive the clinical notes and the turn around time Is 2-5 business days.

## 2017-10-26 NOTE — Telephone Encounter (Signed)
I called to check the status on the tracking number and they informed me it is still pending and waiting on the nurse to review it.. They stated that it takes 15 days for the turn around.

## 2017-11-01 NOTE — Telephone Encounter (Signed)
[employer] [place] did not approve the Cervical spine.  [employer] stated" we see that you have change in sense of feel in your hand feet. The guidelines says that you must have a problem and your doctor must have given doctor's  notes that say your nerve tests (NCV/EMG) are bad. Otherwise the notes must say you did neck stretches (physical therapy, chiropractic treatments, or medically directed home exercise program) for six weeks in the last six months. The notes must say you also did other things. Those things are resting more (decrease activity level) plus using heat and or ice. The notes must say you were taking pain medicine"  A peer to peer is an option. The phone number is [phone] option 3 and tracking number is [id number].

## 2017-11-03 NOTE — Telephone Encounter (Signed)
Noted, thank you

## 2017-11-03 NOTE — Telephone Encounter (Signed)
The MRI Brain has been approved. Authorization [id number] (exp. 11/02/17 to 12/02/17)  Would you want the patient to proceed on having the MRI Brain??

## 2017-11-03 NOTE — Telephone Encounter (Signed)
Ok to proceed witrh MRI brain for now; will hold off on cervical spine. -VRP

## 2018-03-15 ENCOUNTER — Ambulatory Visit (HOSPITAL_COMMUNITY)
Admission: EM | Admit: 2018-03-15 | Discharge: 2018-03-15 | Disposition: A | Discharge status: Home / Self Care | Payer: BLUE CROSS/BLUE SHIELD | Attending: Family Medicine | Admitting: Family Medicine

## 2018-03-15 ENCOUNTER — Other Ambulatory Visit: Payer: Self-pay

## 2018-03-15 ENCOUNTER — Encounter (HOSPITAL_COMMUNITY): Payer: Self-pay | Admitting: *Deleted

## 2018-03-15 DIAGNOSIS — R51 Headache: Secondary | ICD-10-CM

## 2018-03-15 DIAGNOSIS — Z760 Encounter for issue of repeat prescription: Secondary | ICD-10-CM | POA: Diagnosis not present

## 2018-03-15 DIAGNOSIS — I10 Essential (primary) hypertension: Secondary | ICD-10-CM

## 2018-03-15 MED ORDER — AMLODIPINE BESYLATE 10 MG PO TABS: 10.0000 mg | ORAL_TABLET | Freq: Every day | ORAL | 0 refills | Status: DC

## 2018-03-15 MED ORDER — LISINOPRIL-HYDROCHLOROTHIAZIDE 20-12.5 MG PO TABS: 1.0000 | ORAL_TABLET | Freq: Every day | ORAL | 0 refills | Status: DC

## 2018-03-15 NOTE — Discharge Instructions (Addendum)
Take the blood pressure medicine daily You need a primary care doctor to follow your blood pressure and for your bowel complaint

## 2018-03-15 NOTE — ED Provider Notes (Signed)
[facility]    CSN: [id number] Arrival date & time: 03/15/18  4132     History   Chief Complaint Chief Complaint  Patient presents with  . Medication Refill    HPI Shawn Ray is a 67 y.o. male.   HPI  Ran out of BP medicine a month ago Has had increased headaches since then Denies chest pain or shortness of breath Had an episode of dizziness Otherwise well Some loose bowels at times No history of heart disease  Past Medical History:  Diagnosis Date  . Hypertension     Patient Active Problem List   Diagnosis Date Noted  . Essential hypertension 11/18/2016    History reviewed. No pertinent surgical history.     Home Medications    Prior to Admission medications   Medication Sig Start Date End Date Taking? Authorizing Provider  amLODipine (NORVASC) 10 MG tablet Take 1 tablet (10 mg total) by mouth daily. 03/15/18   Eustace Moore, MD  gabapentin (NEURONTIN) 300 MG capsule Take 1 capsule (300 mg total) by mouth 2 (two) times daily. 10/18/17   Penumalli, Glenford Bayley, MD  lisinopril-hydrochlorothiazide (PRINZIDE,ZESTORETIC) 20-12.5 MG tablet Take 1 tablet by mouth daily. 03/15/18   Eustace Moore, MD    Family History Family History  Problem Relation Age of Onset  . Diabetes Mother   . Other Mother        neck pain and arm sensory changes  . Diabetes Sister     Social History Social History   Tobacco Use  . Smoking status: Current Every Day Smoker    Packs/day: 0.50    Years: 50.00    Pack years: 25.00    Types: Pipe  . Smokeless tobacco: Never Used  Substance Use Topics  . Alcohol use: No  . Drug use: No     Allergies   Patient has no allergy information on record.   Review of Systems Review of Systems  Constitutional: Negative for chills and fever.  HENT: Negative for ear pain and sore throat.   Eyes: Negative for pain and visual disturbance.  Respiratory: Negative for cough and shortness of breath.   Cardiovascular: Negative  for chest pain and palpitations.  Gastrointestinal: Negative for abdominal pain and vomiting.  Genitourinary: Negative for dysuria and hematuria.  Musculoskeletal: Negative for arthralgias and back pain.  Skin: Negative for color change and rash.  Neurological: Positive for headaches. Negative for seizures and syncope.  All other systems reviewed and are negative. daughter is here to provide language interpretation   Physical Exam Triage Vital Signs ED Triage Vitals  Enc Vitals Group     BP 03/15/18 0852 (!) 163/86     Pulse Rate 03/15/18 0852 68     Resp 03/15/18 0852 18     Temp 03/15/18 0852 97.9 F (36.6 C)     Temp Source 03/15/18 0852 Oral     SpO2 03/15/18 0852 98 %     Weight --      Height --      Head Circumference --      Peak Flow --      Pain Score 03/15/18 0914 0     Pain Loc --      Pain Edu? --      Excl. in GC? --    No data found.  Updated Vital Signs BP (!) 163/86 (BP Location: Left Arm)   Pulse 68   Temp 97.9 F (36.6 C) (Oral)   Resp  18   SpO2 98%   Physical Exam  Constitutional: He appears well-developed and well-nourished. No distress.  HENT:  Head: Normocephalic and atraumatic.  Mouth/Throat: Oropharynx is clear and moist.  Eyes: Pupils are equal, round, and reactive to light. Conjunctivae are normal.  Neck: Normal range of motion. No JVD present.  Cardiovascular: Normal rate, regular rhythm and normal heart sounds.  Pulmonary/Chest: Effort normal and breath sounds normal. No respiratory distress. He has no rales.  Abdominal: Soft. Bowel sounds are normal. He exhibits no distension.  No HSM  Musculoskeletal: Normal range of motion. He exhibits no edema.  Lymphadenopathy:    He has no cervical adenopathy.  Neurological: He is alert.  Skin: Skin is warm and dry.  Psychiatric: He has a normal mood and affect. His behavior is normal.     UC Treatments / Results  Labs (all labs ordered are listed, but only abnormal results are  displayed) Labs Reviewed - No data to display  EKG None  Radiology No results found.  Procedures Procedures (including critical care time)  Medications Ordered in UC Medications - No data to display  Initial Impression / Assessment and Plan / UC Course  I have reviewed the triage vital signs and the nursing notes.  Pertinent labs & imaging results that were available during my care of the patient were reviewed by me and considered in my medical decision making (see chart for details).      Importance of establishing with a PCP is discussed  Final Clinical Impressions(s) / UC Diagnoses   Final diagnoses:  Medication refill  Essential hypertension     Discharge Instructions     Take the blood pressure medicine daily You need a primary care doctor to follow your blood pressure and for your bowel complaint    ED Prescriptions    Medication Sig Dispense Auth. Provider   amLODipine (NORVASC) 10 MG tablet Take 1 tablet (10 mg total) by mouth daily. 30 tablet Eustace Moore, MD   lisinopril-hydrochlorothiazide (PRINZIDE,ZESTORETIC) 20-12.5 MG tablet Take 1 tablet by mouth daily. 30 tablet Eustace Moore, MD     Controlled Substance Prescriptions [place] Controlled Substance Registry consulted? Not Applicable   Eustace Moore, MD 03/15/18 [phone]

## 2018-03-15 NOTE — ED Triage Notes (Signed)
States he has been without his b/p pressure medication x 1 month

## 2018-04-25 ENCOUNTER — Ambulatory Visit: Payer: Medicare Other | Admitting: Diagnostic Neuroimaging

## 2018-04-26 ENCOUNTER — Encounter: Payer: Self-pay | Admitting: Diagnostic Neuroimaging

## 2018-06-06 ENCOUNTER — Ambulatory Visit (HOSPITAL_COMMUNITY)
Admission: EM | Admit: 2018-06-06 | Discharge: 2018-06-06 | Disposition: A | Discharge status: Home / Self Care | Payer: Medicare Other | Attending: Family Medicine | Admitting: Family Medicine

## 2018-06-06 ENCOUNTER — Encounter (HOSPITAL_COMMUNITY): Payer: Self-pay

## 2018-06-06 DIAGNOSIS — I10 Essential (primary) hypertension: Secondary | ICD-10-CM | POA: Diagnosis not present

## 2018-06-06 DIAGNOSIS — R51 Headache: Secondary | ICD-10-CM | POA: Diagnosis not present

## 2018-06-06 DIAGNOSIS — R519 Headache, unspecified: Secondary | ICD-10-CM

## 2018-06-06 DIAGNOSIS — Z760 Encounter for issue of repeat prescription: Secondary | ICD-10-CM

## 2018-06-06 LAB — POCT I-STAT, CHEM 8
BUN: 7 mg/dL — AB (ref 8–23)
CREATININE: 0.9 mg/dL (ref 0.61–1.24)
Calcium, Ion: 1.22 mmol/L (ref 1.15–1.40)
Chloride: 108 mmol/L (ref 98–111)
Glucose, Bld: 92 mg/dL (ref 70–99)
HEMATOCRIT: 40 % (ref 39.0–52.0)
HEMOGLOBIN: 13.6 g/dL (ref 13.0–17.0)
Potassium: 4.4 mmol/L (ref 3.5–5.1)
Sodium: 141 mmol/L (ref 135–145)
TCO2: 27 mmol/L (ref 22–32)

## 2018-06-06 MED ORDER — AMLODIPINE BESYLATE 10 MG PO TABS: 10.0000 mg | ORAL_TABLET | Freq: Every day | ORAL | 0 refills | Status: AC

## 2018-06-06 MED ORDER — LISINOPRIL-HYDROCHLOROTHIAZIDE 20-12.5 MG PO TABS: 1.0000 | ORAL_TABLET | Freq: Every day | ORAL | 0 refills | Status: AC

## 2018-06-06 NOTE — Discharge Instructions (Signed)
I have refilled your blood pressure medicine EKG and blood work today was normal  Your blood pressure was elevated today in clinic. Please be sure to take blood pressure medications as prescribed. Please monitor your blood pressure at home or when you go to a [facility]. Please follow up with your primary care doctor to recheck blood pressure and discuss any need for medication changes.   Please go to Emergency Room if you start to experience severe headache, vision changes, decreased urine production, chest pain, shortness of breath, speech slurring, one sided weakness.

## 2018-06-06 NOTE — ED Triage Notes (Signed)
Pt b/p has been going up for 1 week/ pt has not gotten a PCP yet. Pt just got approve for his insurance.

## 2018-06-08 NOTE — ED Provider Notes (Signed)
[facility]    CSN: [id number] Arrival date & time: 06/06/18  1317     History   Chief Complaint Chief Complaint  Patient presents with  . Hypertension    HPI Shawn Ray is a 67 y.o. male history of hypertension presenting today for evaluation of elevated blood pressure and need for medication refill.  Patient has been out of his blood pressure medicine for approximately 1 week.  Since he is good developed headaches, occasional blurry vision.  He denies any chest pain or shortness of breath.  He is not set up with a primary care yet.  He is on 10 mg of amlodipine daily as well as 20/12.5 combination tablet of lisinopril and HCTZ.  He denies any issues with these medicines.  Headache is described as mild, denies worse headache of life.  Denies blurry vision or vision changes at time of visit.  HPI  Past Medical History:  Diagnosis Date  . Hypertension     Patient Active Problem List   Diagnosis Date Noted  . Essential hypertension 11/18/2016    History reviewed. No pertinent surgical history.     Home Medications    Prior to Admission medications   Medication Sig Start Date End Date Taking? Authorizing Provider  amLODipine (NORVASC) 10 MG tablet Take 1 tablet (10 mg total) by mouth daily. 06/06/18   Banyan Goodchild C, PA-C  gabapentin (NEURONTIN) 300 MG capsule Take 1 capsule (300 mg total) by mouth 2 (two) times daily. 10/18/17   Penumalli, Glenford BayleyVikram R, MD  lisinopril-hydrochlorothiazide (PRINZIDE,ZESTORETIC) 20-12.5 MG tablet Take 1 tablet by mouth daily. 06/06/18   Danie Diehl, Junius CreamerHallie C, PA-C    Family History Family History  Problem Relation Age of Onset  . Diabetes Mother   . Other Mother        neck pain and arm sensory changes  . Diabetes Sister     Social History Social History   Tobacco Use  . Smoking status: Current Every Day Smoker    Packs/day: 0.50    Years: 50.00    Pack years: 25.00    Types: Pipe  . Smokeless tobacco: Never Used    Substance Use Topics  . Alcohol use: No  . Drug use: No     Allergies   Patient has no allergy information on record.   Review of Systems Review of Systems  Constitutional: Negative for fatigue and fever.  HENT: Negative for congestion, sinus pressure and sore throat.   Eyes: Positive for visual disturbance. Negative for photophobia and pain.  Respiratory: Negative for cough and shortness of breath.   Cardiovascular: Negative for chest pain.  Gastrointestinal: Negative for abdominal pain, nausea and vomiting.  Genitourinary: Negative for decreased urine volume and hematuria.  Musculoskeletal: Negative for myalgias, neck pain and neck stiffness.  Neurological: Positive for headaches. Negative for dizziness, syncope, facial asymmetry, speech difficulty, weakness, light-headedness and numbness.     Physical Exam Triage Vital Signs ED Triage Vitals  Enc Vitals Group     BP 06/06/18 1506 (!) 181/82     Pulse Rate 06/06/18 1506 (!) 58     Resp 06/06/18 1506 18     Temp 06/06/18 1506 97.8 F (36.6 C)     Temp src --      SpO2 06/06/18 1506 100 %     Weight 06/06/18 1508 159 lb 9.6 oz (72.4 kg)     Height --      Head Circumference --  Peak Flow --      Pain Score --      Pain Loc --      Pain Edu? --      Excl. in GC? --    No data found.  Updated Vital Signs BP (!) 181/82 (BP Location: Right Arm)   Pulse (!) 58   Temp 97.8 F (36.6 C)   Resp 18   Wt 159 lb 9.6 oz (72.4 kg)   SpO2 100%   BMI 27.40 kg/m   Visual Acuity Right Eye Distance:   Left Eye Distance:   Bilateral Distance:    Right Eye Near:   Left Eye Near:    Bilateral Near:     Physical Exam Vitals signs and nursing note reviewed.  Constitutional:      Appearance: He is well-developed.  HENT:     Head: Normocephalic and atraumatic.     Mouth/Throat:     Comments: Oral mucosa pink and moist, no tonsillar enlargement or exudate. Posterior pharynx patent and nonerythematous, no uvula  deviation or swelling. Normal phonation. Eyes:     Extraocular Movements: Extraocular movements intact.     Conjunctiva/sclera: Conjunctivae normal.     Pupils: Pupils are equal, round, and reactive to light.  Neck:     Musculoskeletal: Neck supple.  Cardiovascular:     Rate and Rhythm: Normal rate and regular rhythm.     Heart sounds: No murmur.  Pulmonary:     Effort: Pulmonary effort is normal. No respiratory distress.     Breath sounds: Normal breath sounds.     Comments: Breathing comfortably at rest, CTABL, no wheezing, rales or other adventitious sounds auscultated Abdominal:     Palpations: Abdomen is soft.     Tenderness: There is no abdominal tenderness.  Skin:    General: Skin is warm and dry.  Neurological:     General: No focal deficit present.     Mental Status: He is alert and oriented to person, place, and time.     Comments: Patient A&O x3, cranial nerves II-XII grossly intact, strength at shoulders, hips and knees 5/5, equal bilaterally, patellar reflex 2+ bilaterally.Gait without abnormality.       UC Treatments / Results  Labs (all labs ordered are listed, but only abnormal results are displayed) Labs Reviewed  POCT I-STAT, CHEM 8 - Abnormal; Notable for the following components:      Result Value   BUN 7 (*)    All other components within normal limits  I-STAT CHEM 8, ED    EKG None  Radiology No results found.  Procedures Procedures (including critical care time)  Medications Ordered in UC Medications - No data to display  Initial Impression / Assessment and Plan / UC Course  I have reviewed the triage vital signs and the nursing notes.  Pertinent labs & imaging results that were available during my care of the patient were reviewed by me and considered in my medical decision making (see chart for details).     I-STAT obtained to check potassium and kidney function in the medicines, within normal limits.  EKG sinus bradycardia, no acute  signs of ischemia or infarction.  No neuro deficit on exam.  Will refill patient's medicines.  Discussed monitoring blood pressure at home.  Monitor for resolution of symptoms.  Follow-up if symptoms worsening, developing changes in vision, chest pain, shortness of breath, worsening headache, weakness.Discussed strict return precautions. Patient verbalized understanding and is agreeable with plan.  Final Clinical  Impressions(s) / UC Diagnoses   Final diagnoses:  Essential hypertension  Acute nonintractable headache, unspecified headache type     Discharge Instructions     I have refilled your blood pressure medicine EKG and blood work today was normal  Your blood pressure was elevated today in clinic. Please be sure to take blood pressure medications as prescribed. Please monitor your blood pressure at home or when you go to a [facility]. Please follow up with your primary care doctor to recheck blood pressure and discuss any need for medication changes.   Please go to Emergency Room if you start to experience severe headache, vision changes, decreased urine production, chest pain, shortness of breath, speech slurring, one sided weakness.    ED Prescriptions    Medication Sig Dispense Auth. Provider   amLODipine (NORVASC) 10 MG tablet Take 1 tablet (10 mg total) by mouth daily. 30 tablet Kaevon Cotta C, PA-C   lisinopril-hydrochlorothiazide (PRINZIDE,ZESTORETIC) 20-12.5 MG tablet Take 1 tablet by mouth daily. 30 tablet Kambryn Dapolito, [place] C, PA-C     Controlled Substance Prescriptions [place] Controlled Substance Registry consulted? Not Applicable   Lew Dawes, [place] 06/08/18 [phone]

## 2018-08-09 ENCOUNTER — Other Ambulatory Visit: Payer: Self-pay | Admitting: Emergency Medicine

## 2018-08-09 NOTE — Telephone Encounter (Signed)
Copied from CRM [id number]. Topic: Quick Communication - Rx Refill/Question >> Aug 09, 2018  1:03 PM Jilda Roche wrote: Medication: lisinopril-hydrochlorothiazide (PRINZIDE,ZESTORETIC) 20-12.5 MG tablet, amLODipine (NORVASC) 10 MG tablet  Has the patient contacted their pharmacy? Yes.   (Agent: If no, request that the patient contact the pharmacy for the refill.) (Agent: If yes, when and what did the pharmacy advise?)  Preferred Pharmacy (with phone number or street name): [id number] - [place], [place] - [address] AT [facility] [address] [facility] [address] [phone] (Phone) [phone] (Fax)    Agent: Please be advised that RX refills may take up to 3 business days. We ask that you follow-up with your pharmacy.

## 2019-03-10 ENCOUNTER — Encounter (HOSPITAL_COMMUNITY): Payer: Self-pay

## 2019-03-10 ENCOUNTER — Other Ambulatory Visit: Payer: Self-pay

## 2019-03-10 ENCOUNTER — Ambulatory Visit (INDEPENDENT_AMBULATORY_CARE_PROVIDER_SITE_OTHER): Payer: Medicare Other

## 2019-03-10 ENCOUNTER — Ambulatory Visit (HOSPITAL_COMMUNITY)
Admission: EM | Admit: 2019-03-10 | Discharge: 2019-03-10 | Disposition: A | Discharge status: Home / Self Care | Payer: Medicare Other | Attending: Emergency Medicine | Admitting: Emergency Medicine

## 2019-03-10 DIAGNOSIS — R300 Dysuria: Secondary | ICD-10-CM | POA: Diagnosis present

## 2019-03-10 DIAGNOSIS — R1030 Lower abdominal pain, unspecified: Secondary | ICD-10-CM | POA: Diagnosis present

## 2019-03-10 DIAGNOSIS — Z1211 Encounter for screening for malignant neoplasm of colon: Secondary | ICD-10-CM | POA: Diagnosis not present

## 2019-03-10 LAB — COMPREHENSIVE METABOLIC PANEL
ALT: 35 U/L (ref 0–44)
AST: 30 U/L (ref 15–41)
Albumin: 4.5 g/dL (ref 3.5–5.0)
Alkaline Phosphatase: 58 U/L (ref 38–126)
Anion gap: 12 (ref 5–15)
BUN: 12 mg/dL (ref 8–23)
CO2: 22 mmol/L (ref 22–32)
Calcium: 9 mg/dL (ref 8.9–10.3)
Chloride: 106 mmol/L (ref 98–111)
Creatinine, Ser: 1.13 mg/dL (ref 0.61–1.24)
GFR calc Af Amer: >60 mL/min (ref >60)
GFR calc non Af Amer: >60 mL/min (ref >60)
Glucose, Bld: 116 mg/dL — ABNORMAL HIGH (ref 70–99)
Potassium: 4.1 mmol/L (ref 3.5–5.1)
Sodium: 140 mmol/L (ref 135–145)
Total Bilirubin: 1 mg/dL (ref 0.3–1.2)
Total Protein: 7.4 g/dL (ref 6.5–8.1)

## 2019-03-10 LAB — POCT URINALYSIS DIP (DEVICE)
Glucose, UA: NEGATIVE mg/dL
Ketones, ur: NEGATIVE mg/dL
Leukocytes,Ua: NEGATIVE
Nitrite: NEGATIVE
Protein, ur: NEGATIVE mg/dL
Specific Gravity, Urine: >=1.03 (ref 1.005–1.030)
Urobilinogen, UA: 0.2 mg/dL (ref 0.0–1.0)
pH: 5.5 (ref 5.0–8.0)

## 2019-03-10 LAB — OCCULT BLOOD, POC DEVICE: Fecal Occult Bld: NEGATIVE

## 2019-03-10 LAB — CBC
HCT: 40 % (ref 39.0–52.0)
Hemoglobin: 13.2 g/dL (ref 13.0–17.0)
MCH: 25 pg — ABNORMAL LOW (ref 26.0–34.0)
MCHC: 33 g/dL (ref 30.0–36.0)
MCV: 75.6 fL — ABNORMAL LOW (ref 80.0–100.0)
Platelets: 211 10*3/uL (ref 150–400)
RBC: 5.29 MIL/uL (ref 4.22–5.81)
RDW: 13.8 % (ref 11.5–15.5)
WBC: 7.7 10*3/uL (ref 4.0–10.5)
nRBC: 0 % (ref 0.0–0.2)

## 2019-03-10 MED ORDER — CEPHALEXIN 500 MG PO CAPS: 500.0000 mg | ORAL_CAPSULE | Freq: Three times a day (TID) | ORAL | 0 refills | Status: AC

## 2019-03-10 NOTE — Discharge Instructions (Signed)
Take the antibiotic cephalexin as prescribed.    Follow-up with your PCP on Monday to be rechecked.      Return here or go to the emergency department if you have acute worsening abdominal pain, difficulty with urination, fever, chills, or other concerning symptoms.

## 2019-03-10 NOTE — ED Triage Notes (Addendum)
Patient report he is having abdominal pain for 1 day.   [place] interpreter ID# [phone].

## 2019-03-10 NOTE — ED Provider Notes (Signed)
[facility]    CSN: [id number] Arrival date & time: 03/10/19  3785      History   Chief Complaint Chief Complaint  Patient presents with  . Abdominal Pain    HPI Shawn Ray is a 68 y.o. male.   Patient presents with 1 day history of suprapubic abdominal pain and difficulty with urination.  Rates pain 5/10.  He denies fever, chills, vomiting, diarrhea.  Last BM this morning.  No treatments attempted at home.  No aggravating or alleviating factors.  Medical history significant for HTN.    The history is provided by the patient. A language interpreter was used.    Past Medical History:  Diagnosis Date  . Hypertension     Patient Active Problem List   Diagnosis Date Noted  . Essential hypertension 11/18/2016    History reviewed. No pertinent surgical history.     Home Medications    Prior to Admission medications   Medication Sig Start Date End Date Taking? Authorizing Provider  amLODipine (NORVASC) 10 MG tablet Take 1 tablet (10 mg total) by mouth daily. 06/06/18   Wieters, Hallie C, PA-C  cephALEXin (KEFLEX) 500 MG capsule Take 1 capsule (500 mg total) by mouth 3 (three) times daily for 7 days. 03/10/19 03/17/19  Sharion Balloon, NP  gabapentin (NEURONTIN) 300 MG capsule Take 1 capsule (300 mg total) by mouth 2 (two) times daily. 10/18/17   Penumalli, Earlean Polka, MD  lisinopril-hydrochlorothiazide (PRINZIDE,ZESTORETIC) 20-12.5 MG tablet Take 1 tablet by mouth daily. 06/06/18   Wieters, Elesa Hacker, PA-C    Family History Family History  Problem Relation Age of Onset  . Diabetes Mother   . Other Mother        neck pain and arm sensory changes  . Diabetes Sister     Social History Social History   Tobacco Use  . Smoking status: Current Every Day Smoker    Packs/day: 0.50    Years: 50.00    Pack years: 25.00    Types: Pipe  . Smokeless tobacco: Never Used  Substance Use Topics  . Alcohol use: No  . Drug use: No     Allergies   Patient has no allergy  information on record.   Review of Systems Review of Systems  Constitutional: Negative for chills and fever.  HENT: Negative for ear pain and sore throat.   Eyes: Negative for pain and visual disturbance.  Respiratory: Negative for cough and shortness of breath.   Cardiovascular: Negative for chest pain and palpitations.  Gastrointestinal: Positive for abdominal pain. Negative for constipation, diarrhea, nausea and vomiting.  Genitourinary: Positive for difficulty urinating. Negative for discharge, dysuria, flank pain, hematuria and testicular pain.  Musculoskeletal: Negative for arthralgias and back pain.  Skin: Negative for color change and rash.  Neurological: Negative for seizures and syncope.  All other systems reviewed and are negative.    Physical Exam Triage Vital Signs ED Triage Vitals  Enc Vitals Group     BP      Pulse      Resp      Temp      Temp src      SpO2      Weight      Height      Head Circumference      Peak Flow      Pain Score      Pain Loc      Pain Edu?      Excl. in [place]?  No data found.  Updated Vital Signs BP (!) 153/80 (BP Location: Left Arm)   Pulse 96   Temp 98.5 F (36.9 C) (Oral)   Resp 16   SpO2 97%   Visual Acuity Right Eye Distance:   Left Eye Distance:   Bilateral Distance:    Right Eye Near:   Left Eye Near:    Bilateral Near:     Physical Exam Vitals signs and nursing note reviewed.  Constitutional:      General: He is not in acute distress.    Appearance: He is well-developed. He is not ill-appearing.  HENT:     Head: Normocephalic and atraumatic.     Mouth/Throat:     Mouth: Mucous membranes are moist.     Pharynx: Oropharynx is clear.  Eyes:     Conjunctiva/sclera: Conjunctivae normal.  Neck:     Musculoskeletal: Neck supple.  Cardiovascular:     Rate and Rhythm: Normal rate and regular rhythm.     Heart sounds: No murmur.  Pulmonary:     Effort: Pulmonary effort is normal. No respiratory distress.      Breath sounds: Normal breath sounds.  Abdominal:     General: Bowel sounds are normal.     Palpations: Abdomen is soft.     Tenderness: There is abdominal tenderness in the suprapubic area and left lower quadrant. There is no right CVA tenderness, left CVA tenderness, guarding or rebound.  Genitourinary:    Prostate: Not tender.  Skin:    General: Skin is warm and dry.     Findings: No rash.  Neurological:     General: No focal deficit present.     Mental Status: He is alert.     Gait: Gait normal.      UC Treatments / Results  Labs (all labs ordered are listed, but only abnormal results are displayed) Labs Reviewed  CBC - Abnormal; Notable for the following components:      Result Value   MCV 75.6 (*)    MCH 25.0 (*)    All other components within normal limits  COMPREHENSIVE METABOLIC PANEL - Abnormal; Notable for the following components:   Glucose, Bld 116 (*)    All other components within normal limits  POCT URINALYSIS DIP (DEVICE) - Abnormal; Notable for the following components:   Bilirubin Urine SMALL (*)    Hgb urine dipstick MODERATE (*)    All other components within normal limits  URINE CULTURE  POC OCCULT BLOOD, ED  OCCULT BLOOD, POC DEVICE    EKG   Radiology Dg Abd 1 View  Result Date: 03/10/2019 CLINICAL DATA:  Mid to lower abdominal pain for 1 day. EXAM: ABDOMEN - 1 VIEW COMPARISON:  CT abdomen and pelvis 11/26/2015. FINDINGS: Gas-filled but nondilated loops of small bowel are seen in the left abdomen. There is some gas and stool in the colon. No abnormal abdominal calcification is identified. No bony abnormality. IMPRESSION: Gas-filled but nondilated loops of small bowel in the left abdomen may be incidental or could reflect enteritis. The appearance is not suggestive of obstruction. Electronically Signed   By: Drusilla Kanner M.D.   On: 03/10/2019 10:08    Procedures Procedures (including critical care time)  Medications Ordered in UC  Medications - No data to display  Initial Impression / Assessment and Plan / UC Course  I have reviewed the triage vital signs and the nursing notes.  Pertinent labs & imaging results that were available during my care of  the patient were reviewed by me and considered in my medical decision making (see chart for details).       Abdominal pain; Dysuria.  Stool: negative for occult blood.  Urine: mod blood, small bilirubin; Urine culture pending.  Abdominal xray 1-view: report "Gas-filled but nondilated loops of small bowel in the left abdomen may be incidental or could reflect enteritis. The appearance is not suggestive of obstruction."  CBC and CMP not acute.  Treating with cephalexin and instructions to follow-up with his PCP on Monday.  Instructed patient to return here or go to the emergency department if he has worsening pain, difficulty producing urine, fever, chills, or other concerns.  Patient agrees with plan of care.        Final Clinical Impressions(s) / UC Diagnoses   Final diagnoses:  Lower abdominal pain  Dysuria     Discharge Instructions     Take the antibiotic cephalexin as prescribed.    Follow-up with your PCP on Monday to be rechecked.      Return here or go to the emergency department if you have acute worsening abdominal pain, difficulty with urination, fever, chills, or other concerning symptoms.            ED Prescriptions    Medication Sig Dispense Auth. Provider   cephALEXin (KEFLEX) 500 MG capsule Take 1 capsule (500 mg total) by mouth 3 (three) times daily for 7 days. 21 capsule Mickie Bailate, Bana Borgmeyer H, NP     PDMP not reviewed this encounter.   Mickie Bailate, Clotiel Troop H, NP 03/10/19 1126

## 2019-03-14 LAB — URINE CULTURE: Culture: 70000 — AB

## 2020-03-13 IMAGING — DX DG ABDOMEN 1V
1 series · 1 of 1 positions shown · non-contrast
Comparison: CT abdomen and pelvis 11/26/2015.

CLINICAL DATA: Mid to lower abdominal pain for 1 day.

EXAM:
ABDOMEN - 1 VIEW

[abdomen kub]
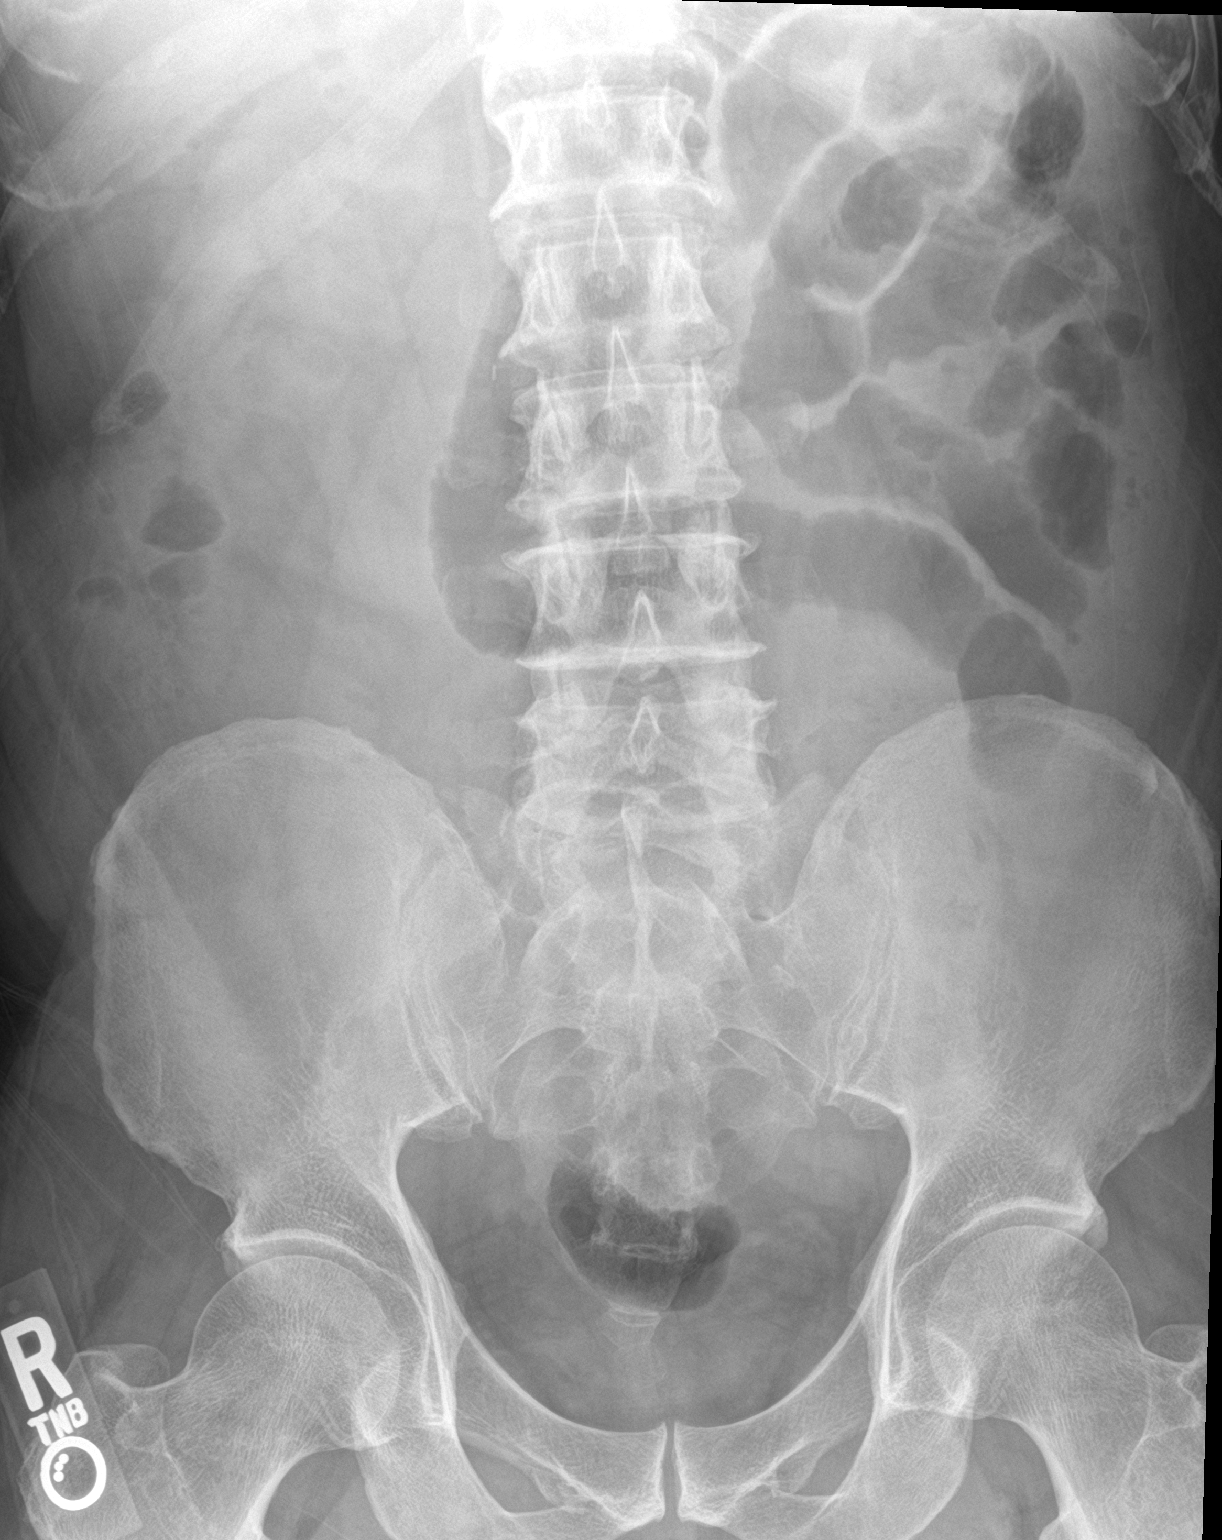

[1 of 1 positions shown; findings below may reference images not displayed]

FINDINGS: Gas-filled but nondilated loops of small bowel are seen in the left
abdomen. There is some gas and stool in the colon. No abnormal
abdominal calcification is identified. No bony abnormality.
IMPRESSION: Gas-filled but nondilated loops of small bowel in the left abdomen
may be incidental or could reflect enteritis. The appearance is not
suggestive of obstruction.
